# Patient Record
Sex: Female | Born: 1975
Health system: Southern US, Community
[De-identification: ages and names within clinical notes are randomized; demographics above are authoritative.]

## PROBLEM LIST (undated history)

## (undated) DIAGNOSIS — E079 Disorder of thyroid, unspecified: Secondary | ICD-10-CM

## (undated) DIAGNOSIS — E119 Type 2 diabetes mellitus without complications: Secondary | ICD-10-CM

## (undated) DIAGNOSIS — I10 Essential (primary) hypertension: Secondary | ICD-10-CM

## (undated) DIAGNOSIS — E785 Hyperlipidemia, unspecified: Secondary | ICD-10-CM

## (undated) HISTORY — DX: Essential (primary) hypertension: I10

## (undated) HISTORY — DX: Disorder of thyroid, unspecified: E07.9

## (undated) HISTORY — DX: Hyperlipidemia, unspecified: E78.5

## (undated) HISTORY — DX: Type 2 diabetes mellitus without complications: E11.9

---
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
## 1999-06-21 ENCOUNTER — Other Ambulatory Visit: Admission: RE | Admit: 1999-06-21 | Discharge: 1999-06-21 | Payer: Self-pay | Admitting: Obstetrics and Gynecology

## 1999-12-06 ENCOUNTER — Emergency Department (HOSPITAL_COMMUNITY): Admission: EM | Admit: 1999-12-06 | Discharge: 1999-12-06 | Payer: Self-pay | Admitting: Emergency Medicine

## 2000-09-22 ENCOUNTER — Other Ambulatory Visit: Admission: RE | Admit: 2000-09-22 | Discharge: 2000-09-22 | Payer: Self-pay | Admitting: Obstetrics and Gynecology

## 2001-09-29 ENCOUNTER — Other Ambulatory Visit: Admission: RE | Admit: 2001-09-29 | Discharge: 2001-09-29 | Payer: Self-pay | Admitting: Obstetrics and Gynecology

## 2001-12-29 ENCOUNTER — Other Ambulatory Visit: Admission: RE | Admit: 2001-12-29 | Discharge: 2001-12-29 | Payer: Self-pay | Admitting: Obstetrics and Gynecology

## 2002-05-28 ENCOUNTER — Other Ambulatory Visit: Admission: RE | Admit: 2002-05-28 | Discharge: 2002-05-28 | Payer: Self-pay | Admitting: Obstetrics and Gynecology

## 2002-07-28 ENCOUNTER — Ambulatory Visit (HOSPITAL_COMMUNITY): Admission: RE | Admit: 2002-07-28 | Discharge: 2002-07-28 | Payer: Self-pay | Admitting: Obstetrics and Gynecology

## 2002-07-28 ENCOUNTER — Encounter: Payer: Self-pay | Admitting: Obstetrics and Gynecology

## 2002-08-05 ENCOUNTER — Inpatient Hospital Stay (HOSPITAL_COMMUNITY): Admission: AD | Admit: 2002-08-05 | Discharge: 2002-08-05 | Payer: Self-pay | Admitting: Obstetrics and Gynecology

## 2002-08-28 ENCOUNTER — Inpatient Hospital Stay (HOSPITAL_COMMUNITY): Admission: AD | Admit: 2002-08-28 | Discharge: 2002-08-28 | Payer: Self-pay | Admitting: Obstetrics and Gynecology

## 2002-09-06 ENCOUNTER — Encounter: Payer: Self-pay | Admitting: Obstetrics and Gynecology

## 2002-09-06 ENCOUNTER — Ambulatory Visit (HOSPITAL_COMMUNITY): Admission: RE | Admit: 2002-09-06 | Discharge: 2002-09-06 | Payer: Self-pay | Admitting: Obstetrics and Gynecology

## 2002-09-15 ENCOUNTER — Inpatient Hospital Stay (HOSPITAL_COMMUNITY): Admission: AD | Admit: 2002-09-15 | Discharge: 2002-09-15 | Payer: Self-pay | Admitting: Obstetrics and Gynecology

## 2002-10-07 ENCOUNTER — Ambulatory Visit (HOSPITAL_COMMUNITY): Admission: RE | Admit: 2002-10-07 | Discharge: 2002-10-07 | Payer: Self-pay | Admitting: Obstetrics and Gynecology

## 2002-10-07 ENCOUNTER — Encounter: Payer: Self-pay | Admitting: Obstetrics and Gynecology

## 2002-10-16 ENCOUNTER — Inpatient Hospital Stay (HOSPITAL_COMMUNITY): Admission: AD | Admit: 2002-10-16 | Discharge: 2002-10-16 | Payer: Self-pay | Admitting: Obstetrics and Gynecology

## 2002-10-25 ENCOUNTER — Inpatient Hospital Stay (HOSPITAL_COMMUNITY): Admission: AD | Admit: 2002-10-25 | Discharge: 2002-10-30 | Payer: Self-pay | Admitting: Obstetrics and Gynecology

## 2002-10-29 ENCOUNTER — Encounter (INDEPENDENT_AMBULATORY_CARE_PROVIDER_SITE_OTHER): Payer: Self-pay

## 2002-12-10 ENCOUNTER — Other Ambulatory Visit: Admission: RE | Admit: 2002-12-10 | Discharge: 2002-12-10 | Payer: Self-pay | Admitting: Obstetrics and Gynecology

## 2003-08-03 ENCOUNTER — Other Ambulatory Visit: Admission: RE | Admit: 2003-08-03 | Discharge: 2003-08-03 | Payer: Self-pay | Admitting: Obstetrics and Gynecology

## 2003-08-03 ENCOUNTER — Other Ambulatory Visit: Admission: RE | Admit: 2003-08-03 | Discharge: 2003-08-03 | Payer: Self-pay | Admitting: Internal Medicine

## 2004-03-09 ENCOUNTER — Other Ambulatory Visit: Admission: RE | Admit: 2004-03-09 | Discharge: 2004-03-09 | Payer: Self-pay | Admitting: Obstetrics and Gynecology

## 2004-10-04 ENCOUNTER — Other Ambulatory Visit: Admission: RE | Admit: 2004-10-04 | Discharge: 2004-10-04 | Payer: Self-pay | Admitting: Obstetrics and Gynecology

## 2005-05-07 ENCOUNTER — Other Ambulatory Visit: Admission: RE | Admit: 2005-05-07 | Discharge: 2005-05-07 | Payer: Self-pay | Admitting: Obstetrics and Gynecology

## 2006-06-19 ENCOUNTER — Other Ambulatory Visit: Admission: RE | Admit: 2006-06-19 | Discharge: 2006-06-19 | Payer: Self-pay | Admitting: Obstetrics and Gynecology

## 2007-03-12 ENCOUNTER — Encounter (INDEPENDENT_AMBULATORY_CARE_PROVIDER_SITE_OTHER): Payer: Self-pay | Admitting: *Deleted

## 2007-03-12 ENCOUNTER — Ambulatory Visit (HOSPITAL_COMMUNITY): Admission: RE | Admit: 2007-03-12 | Discharge: 2007-03-12 | Payer: Self-pay | Admitting: Family Medicine

## 2007-05-23 ENCOUNTER — Emergency Department (HOSPITAL_COMMUNITY): Admission: EM | Admit: 2007-05-23 | Discharge: 2007-05-23 | Payer: Self-pay | Admitting: Family Medicine

## 2008-01-03 IMAGING — US US ABDOMEN COMPLETE
1 series · 14 of 25 positions shown · non-contrast
Comparison: none

CLINICAL DATA: Elevated LFTs.  
ABDOMEN ULTRASOUND:
TECHNIQUE: Complete abdominal ultrasound examination was performed including evaluation of the liver, gallbladder, bile ducts, pancreas, kidneys, spleen, IVC, and abdominal aorta.

[Series 1: unknown · 0.32mm/px · 14 of 58 slices shown]
[im 1/58]
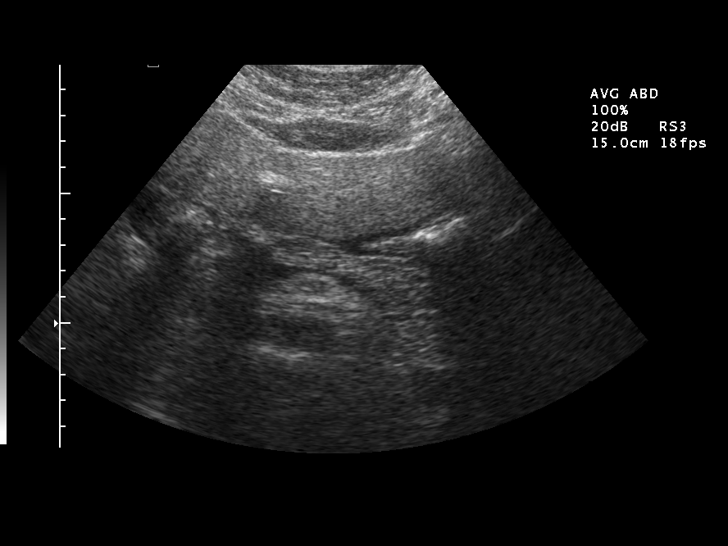
[im 5/58]
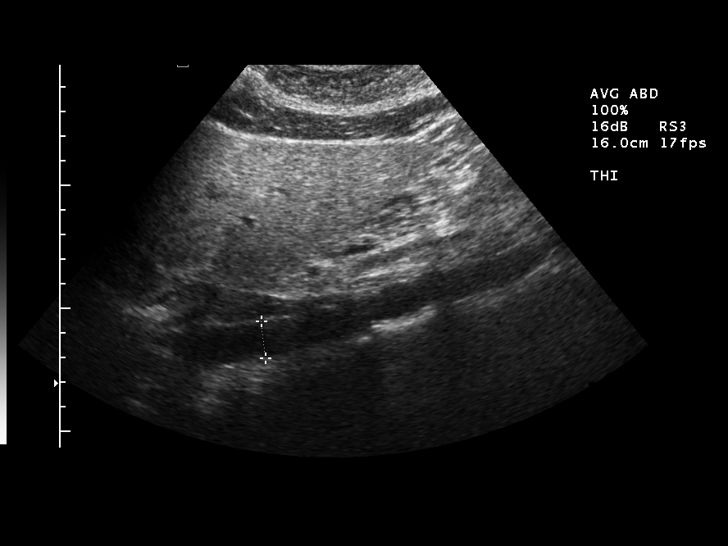
[im 10/58]
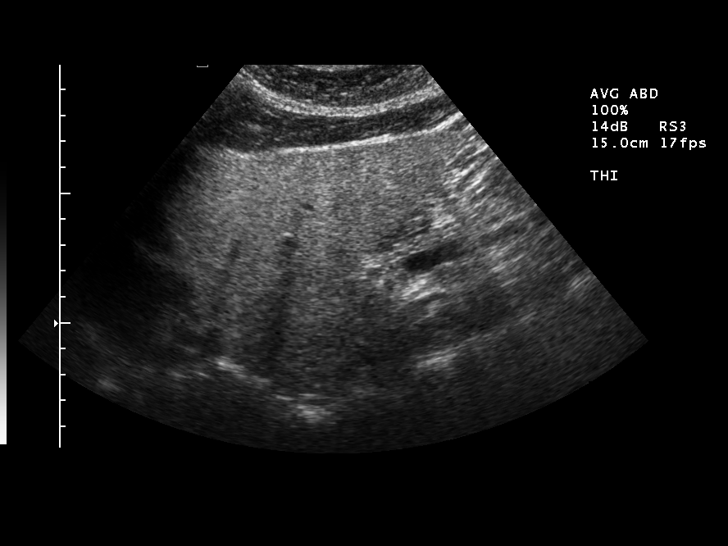
[im 15/58]
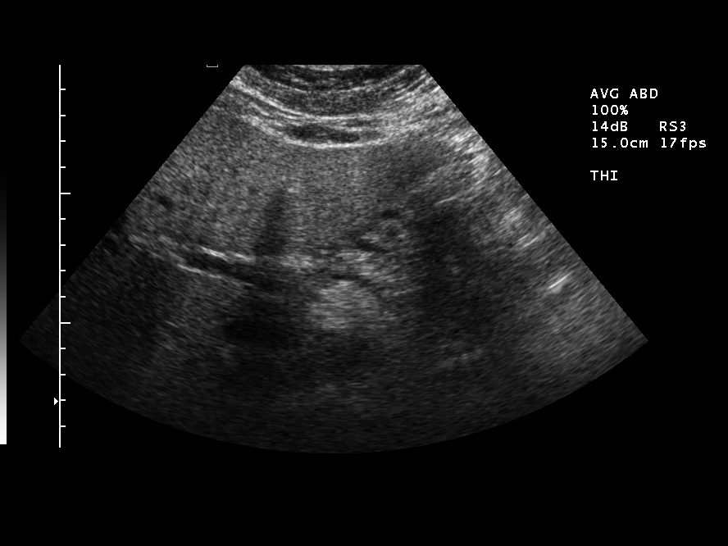
[im 20/58]
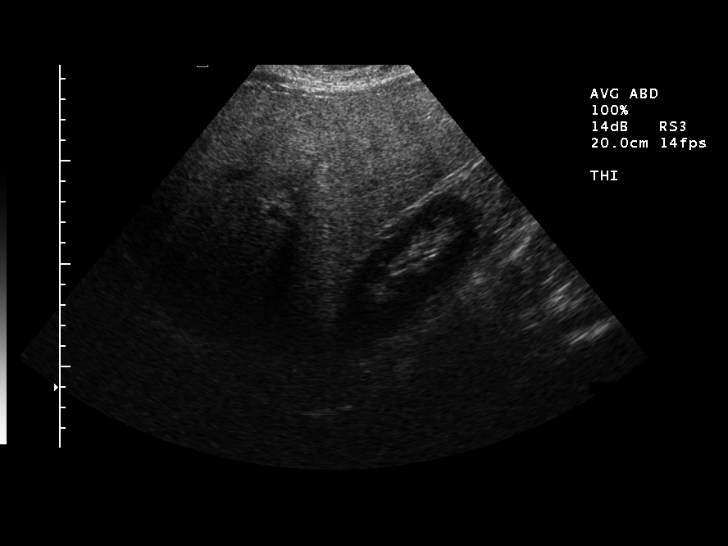
[im 22/58]
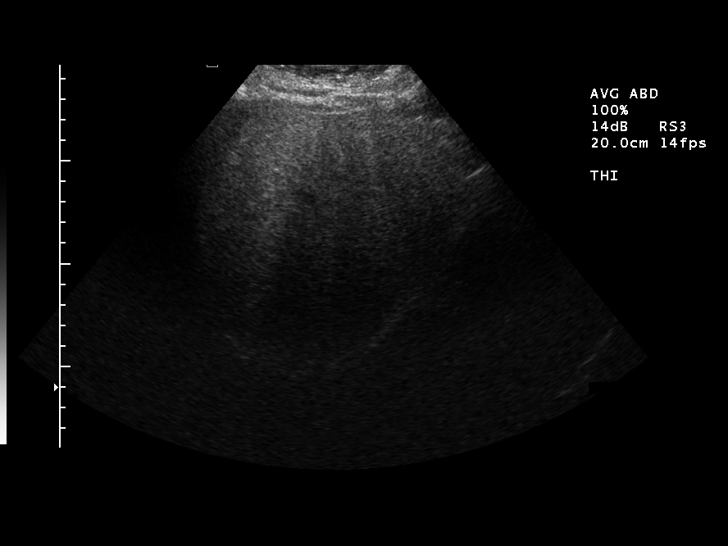
[im 27/58]
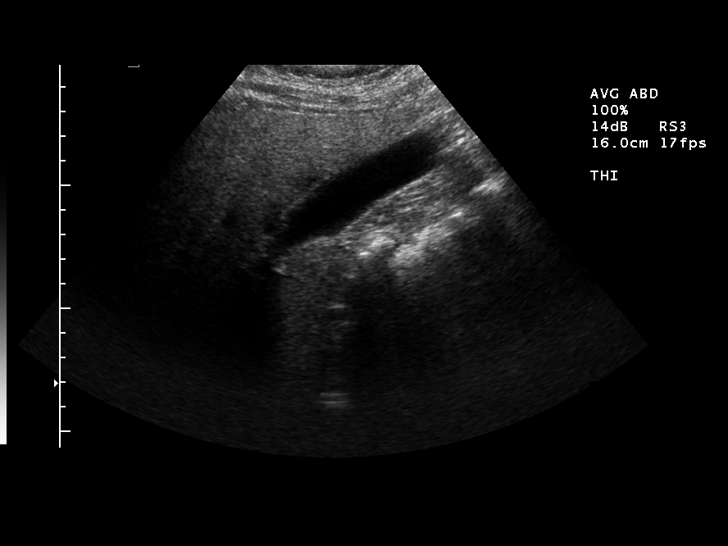
[im 31/58]
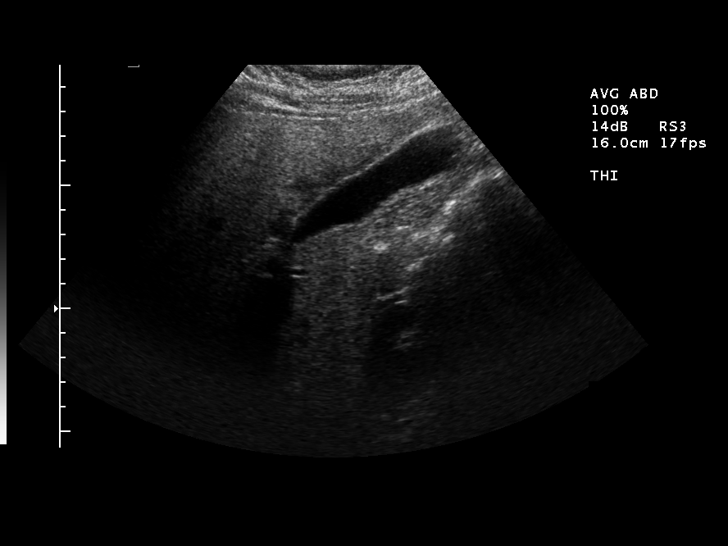
[im 36/58]
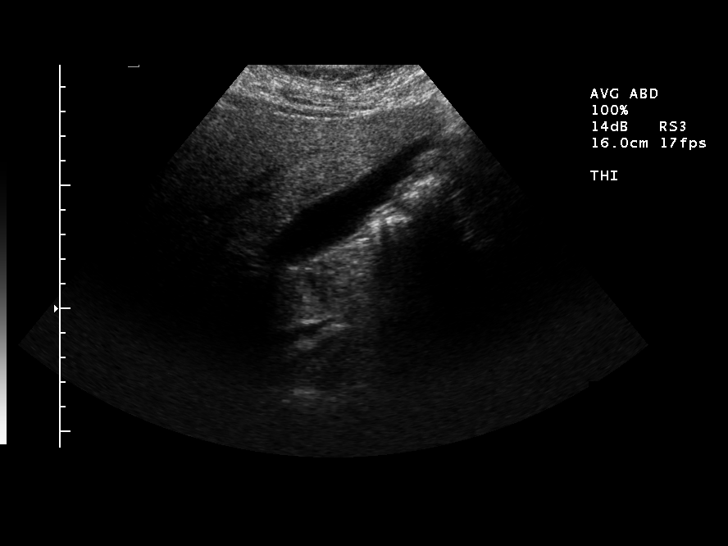
[im 39/58]
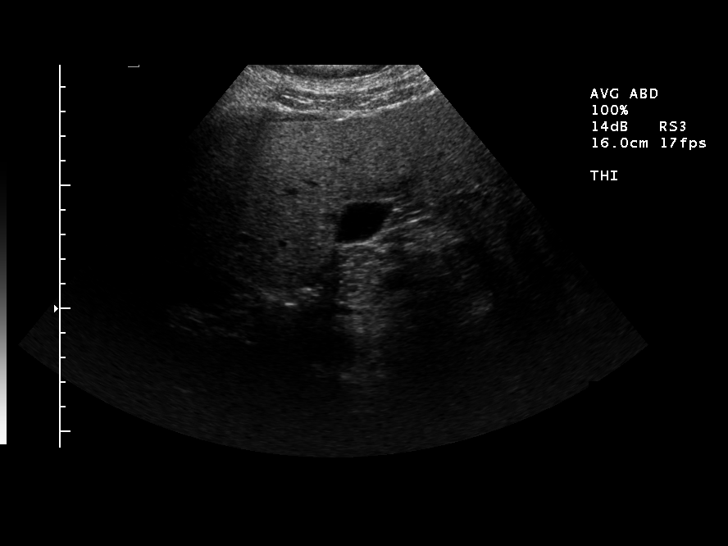
[im 43/58]
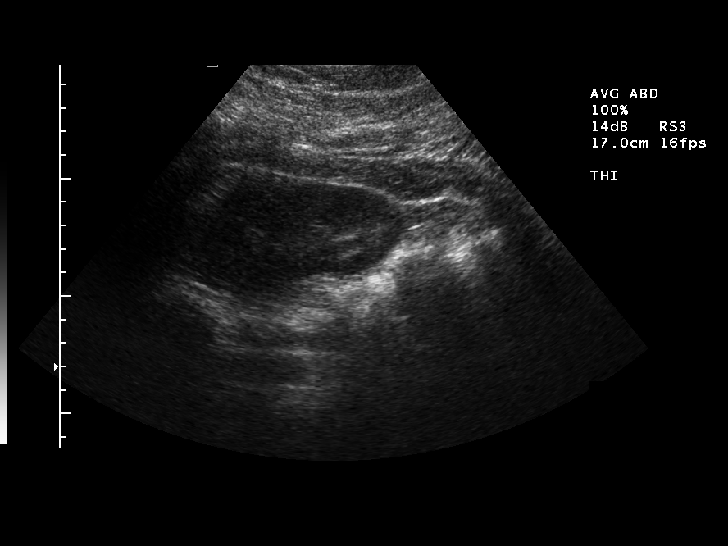
[im 48/58]
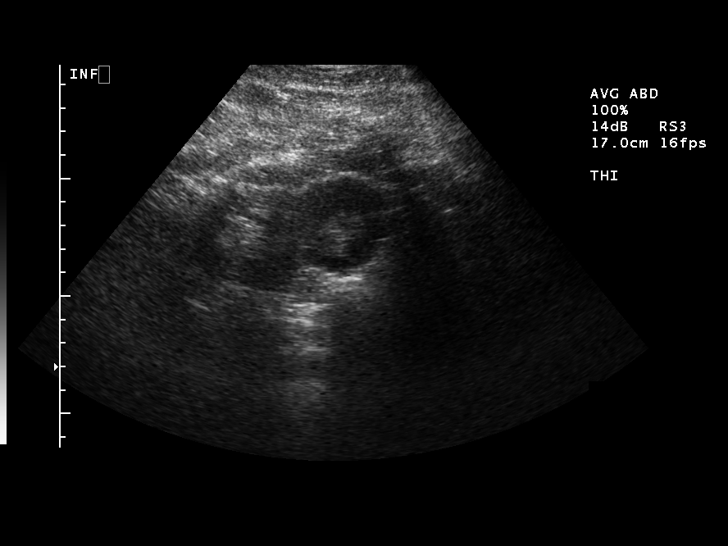
[im 53/58]
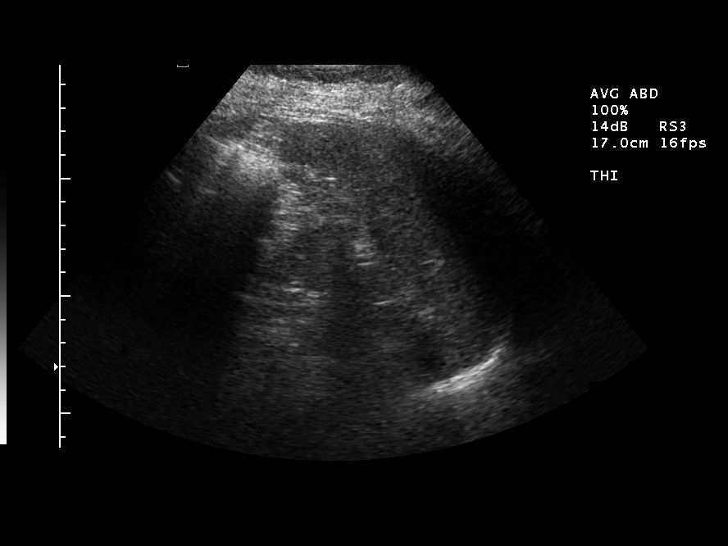
[im 58/58]
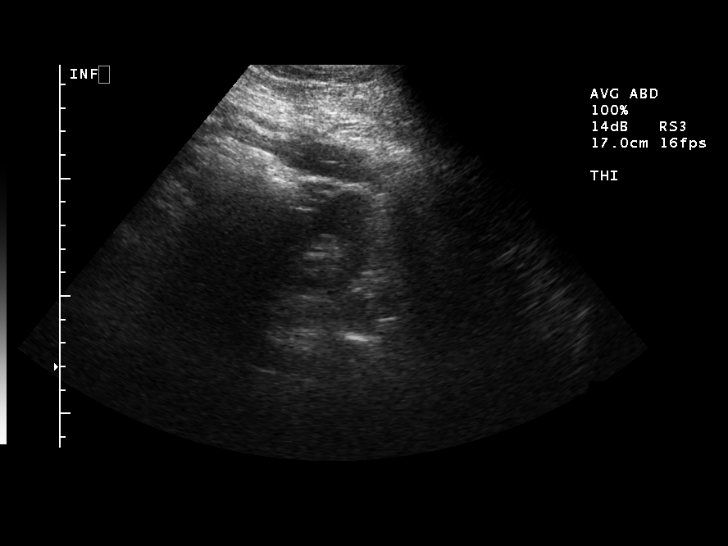

[14 of 25 positions shown; findings below may reference images not displayed]

FINDINGS: Normal gallbladder.  Gallbladder wall thickness is 2.3 mm.  Common duct measures 4.8 mm in diameter which is within normal limits.  Diffuse increase in hepatic echogenicity compatible with diffuse hepatocellular disease, likely fatty infiltration.  Spleen measures 12.0 cm in length.  Normal pancreas and kidneys.  Right and left kidneys measure 11.6 cm and 11.3 cm in length, respectively.  The maximum abdominal aortic diameter is 1.5 cm.  Patent IVC.  No acute abdominal process.
IMPRESSION: Findings are compatible with diffuse fatty infiltration of the liver.  Normal gallbladder.  No biliary ductal dilatation.

## 2008-09-21 DIAGNOSIS — K7689 Other specified diseases of liver: Secondary | ICD-10-CM | POA: Insufficient documentation

## 2008-09-21 DIAGNOSIS — E039 Hypothyroidism, unspecified: Secondary | ICD-10-CM | POA: Insufficient documentation

## 2008-09-21 DIAGNOSIS — K625 Hemorrhage of anus and rectum: Secondary | ICD-10-CM | POA: Insufficient documentation

## 2008-09-27 ENCOUNTER — Ambulatory Visit: Payer: Self-pay | Admitting: Internal Medicine

## 2008-09-27 DIAGNOSIS — R112 Nausea with vomiting, unspecified: Secondary | ICD-10-CM | POA: Insufficient documentation

## 2008-10-11 ENCOUNTER — Ambulatory Visit: Payer: Self-pay | Admitting: Internal Medicine

## 2008-11-07 ENCOUNTER — Telehealth: Payer: Self-pay | Admitting: Internal Medicine

## 2008-11-07 DIAGNOSIS — K649 Unspecified hemorrhoids: Secondary | ICD-10-CM | POA: Insufficient documentation

## 2008-11-08 ENCOUNTER — Ambulatory Visit (HOSPITAL_COMMUNITY): Admission: RE | Admit: 2008-11-08 | Discharge: 2008-11-08 | Payer: Self-pay | Admitting: Internal Medicine

## 2008-11-08 ENCOUNTER — Ambulatory Visit: Payer: Self-pay | Admitting: Internal Medicine

## 2008-12-07 ENCOUNTER — Ambulatory Visit: Payer: Self-pay | Admitting: Internal Medicine

## 2010-04-17 ENCOUNTER — Emergency Department (HOSPITAL_COMMUNITY): Admission: EM | Admit: 2010-04-17 | Discharge: 2010-04-17 | Payer: Self-pay | Admitting: Emergency Medicine

## 2011-03-08 NOTE — Discharge Summary (Signed)
NAME:  Cheryl Hull, Cheryl Hull                           ACCOUNT NO.:  [id number]   MEDICAL RECORD NO.:  [id number]                   PATIENT TYPE:  INP   LOCATION:  9122                                 FACILITY:  [facility]   PHYSICIAN:  Zenaida Niece, M.D.             DATE OF BIRTH:  [birth year]   DATE OF ADMISSION:  10/25/2002  DATE OF DISCHARGE:  10/30/2002                                 DISCHARGE SUMMARY   ADMISSION DIAGNOSIS:  Intrauterine pregnancy at 31 weeks, preterm labor.   DISCHARGE DIAGNOSIS:  Intrauterine pregnancy at 31 weeks, preterm labor.   PROCEDURE:  On January 9th, she had a vaginal birth after cesarean section.   HISTORY AND PHYSICAL:  This is a 35 year old white female, gravida 3, para 0-  2-0-02, with an EGA of [redacted] weeks by an 8-week ultrasound with a due date of  March 8, who presents with a complaint of increasing contractions and with  cervical change by an exam in the office.  The pregnancy has been  complicated by preterm contractions and a history of preterm delivery x2.  She had been getting weekly progesterone injections since 24 weeks and has  been on terbutaline p.r.n.  She had a negative fetal fibronectin x2.  On the  day of admission, the patient complained of increased contractions and  nausea, and exam in the office revealed her to be 1, 50, minus 2, which is a  change from her previous exams.  She was sent to maternity admissions where  her contractions persisted despite subcu terbutaline x2.  Prenatal care also  complicated by diarrhea at 10 weeks with a negative stool culture which  improved with increased fiber, mood swings treated with Prozac, viral URI at  26 weeks, and the above-mentioned preterm contractions.  She also had a  recent ultrasound which revealed no cervical shortening or dilation.   PRENATAL LABORATORY DATA:  Blood type is O positive with a negative antibody  screen, RPR nonreactive, rubella immune, hepatitis B surface antigen  negative, HIV negative, gonorrhea and Chlamydia negative, triple screen  normal.  One-hour Glucola 145; 3-hour GTT 75, 124, 84, and 96.   PAST OB HISTORY:  1993 vaginal delivery at 35 weeks following  __________  complicated by preterm labor.  1998, low transverse cesarean section at 34  weeks, 6 pounds 1 ounce preterm labor with nonreassuring fetal heart tracing  and probable abruption from which she is cleared for trial labor.   GYNECOLOGIC HISTORY:  CIN 1 in April 2003.   PAST MEDICAL HISTORY:  Hypothyroidism.   PAST SURGICAL HISTORY:  Low transverse cesarean section.   MEDICATIONS:  Weekly progesterone injections, Prozac 20 mg p.o. daily,  Synthroid 75 mcg daily.   PHYSICAL EXAMINATION:  On admission, she was afebrile with stable vital  signs.  Fetal heart tracing reactive with contractions every 3-6 minutes.  Abdomen gravid, nontender, with an appropriate fundal height.  Vaginal exam  was 1, 50, minus 2, vertex presentation, intact bag of water.   HOSPITAL COURSE:  The patient was admitted due to the failure of her  contractions to quiesce with terbutaline.  She was placed on magnesium  sulfate for tocolysis and given betamethasone for pulmonary maturation.  Urinalysis was normal, and a group B strep was performed.  Contractions  slowed down but did not stop.  The magnesium was increased.  On the morning  of January 7, she was on magnesium 2 g/hour.  Contractions were less  frequent but as strong as without her magnesium.  The patient requested  discontinuation of the magnesium.  Risks and options were discussed with the  patient, and magnesium was discontinued.  Contractions initially did not  change.  She was tried on Procardia 10 mg q.6h. and observed.  Even though  contractions increased, she continued to decline magnesium.  On the morning  of January 8, Dr. Ambrose Mantle checked the patient.  Her cervix was 2+, 80, and  minus 1 with a vertex presentation.  She was then  transferred to labor and  delivery and continued to contract.  Group B strep performed on admission  came back negative.  Early on the morning of January 9, she progressed to 4,  90, minus 1 to zero station, and an amniotomy was performed by Dr. Ambrose Mantle  which revealed clear fluid.  She received an epidural.  She then progressed  fairly rapidly to complete dilation, had a vertex of +3 station, and  developed fetal bradycardia.  She had a spontaneous VBAC with two pushes  with a viable female infant with Apgars of 7 and 8 that weighed 3 pounds, 15  ounces.  Placenta was intact, and uterus was normal.  She had a first-degree  laceration repaired with 2-0 Vicryl, and estimated blood loss was 500 cc.  Postpartum, she did very well, was able to ambulate, tolerate her diet, and  remained afebrile.  Predelivery hemoglobin 10.6, post delivery 11.3.  On the  morning of postpartum day #1, the patient requested discharge home.  The  baby was stable in the NICU without significant problems.   DISCHARGE INSTRUCTIONS:  Regular diet, pelvic rest.  Follow up in six weeks.   MEDICATIONS:  Over-the-counter Motrin p.r.n., and she was given our  discharge pamphlet.                                               Zenaida Niece, M.D.    TDM/MEDQ  D:  10/30/2002  T:  10/31/2002  Job:  [id number]

## 2015-09-21 ENCOUNTER — Encounter: Payer: Self-pay | Admitting: Internal Medicine

## 2015-10-22 ENCOUNTER — Emergency Department (HOSPITAL_COMMUNITY)
Admission: EM | Admit: 2015-10-22 | Discharge: 2015-10-23 | Disposition: A | Discharge status: Home / Self Care | Payer: 59 | Attending: Emergency Medicine | Admitting: Emergency Medicine

## 2015-10-22 ENCOUNTER — Encounter (HOSPITAL_COMMUNITY): Payer: Self-pay | Admitting: Family Medicine

## 2015-10-22 DIAGNOSIS — Z79899 Other long term (current) drug therapy: Secondary | ICD-10-CM | POA: Insufficient documentation

## 2015-10-22 DIAGNOSIS — T783XXA Angioneurotic edema, initial encounter: Secondary | ICD-10-CM | POA: Diagnosis not present

## 2015-10-22 DIAGNOSIS — T464X5A Adverse effect of angiotensin-converting-enzyme inhibitors, initial encounter: Secondary | ICD-10-CM | POA: Insufficient documentation

## 2015-10-22 DIAGNOSIS — R220 Localized swelling, mass and lump, head: Secondary | ICD-10-CM | POA: Diagnosis present

## 2015-10-22 MED ORDER — SODIUM CHLORIDE 0.9 % IV BOLUS (SEPSIS)
1000.0000 mL | Freq: Once | INTRAVENOUS | Status: AC
  Administered 2015-10-23: 1000 mL via INTRAVENOUS

## 2015-10-22 MED ORDER — METHYLPREDNISOLONE SODIUM SUCC 125 MG IJ SOLR
125.0000 mg | Freq: Once | INTRAMUSCULAR | Status: AC
  Administered 2015-10-22: 125 mg via INTRAVENOUS
  Filled 2015-10-22: qty 2

## 2015-10-22 MED ORDER — FAMOTIDINE IN NACL 20-0.9 MG/50ML-% IV SOLN
20.0000 mg | Freq: Once | INTRAVENOUS | Status: AC
  Administered 2015-10-22: 20 mg via INTRAVENOUS
  Filled 2015-10-22: qty 50

## 2015-10-22 MED ORDER — EPINEPHRINE 0.3 MG/0.3ML IJ SOAJ
0.3000 mg | Freq: Once | INTRAMUSCULAR | Status: AC | PRN
  Administered 2015-10-23: 0.3 mg via INTRAMUSCULAR
  Filled 2015-10-22: qty 0.3

## 2015-10-22 MED ORDER — DIPHENHYDRAMINE HCL 50 MG/ML IJ SOLN
25.0000 mg | Freq: Once | INTRAMUSCULAR | Status: AC
Start: 2015-10-23 — End: 2015-10-22
  Administered 2015-10-22: 25 mg via INTRAVENOUS
  Filled 2015-10-22: qty 1

## 2015-10-22 NOTE — ED Provider Notes (Signed)
CSN: [id number]     Arrival date & time 10/22/15  2331 History  .By signing my name below, I, Cheryl Hull, attest that this documentation has been prepared under the direction and in the presence of TRW AutomotiveKelly Jenson Beedle, PA-C. Electronically Signed: Phillis HaggisGabriella Hull, ED Scribe. 10/22/2015. 11:51 PM.  Chief Complaint  Patient presents with  . Allergic Reaction   The history is provided by the patient. No language interpreter was used.   HPI Comments: Cheryl Hull is a 40 y.o. female who presents to the Emergency Department complaining of facial swelling onset 10 hours ago. Pt states that the swelling first began with her upper lip and progressed to her bilateral cheeks and eyes. Pt is on Lisinopril and has been for years. She states that she has taken two benadryl to no relief. She denies throat swelling, tongue swelling, trouble swallowing, chest pain, SOB, or wheezing.   History reviewed. No pertinent past medical history. History reviewed. No pertinent past surgical history. History reviewed. No pertinent family history. Social History  Substance Use Topics  . Smoking status: Never Smoker   . Smokeless tobacco: None  . Alcohol Use: No   OB History    No data available      Review of Systems  HENT: Positive for facial swelling. Negative for trouble swallowing.   Respiratory: Negative for shortness of breath.   All other systems reviewed and are negative.   Allergies  Review of patient's allergies indicates no known allergies.  Home Medications   Prior to Admission medications   Medication Sig Start Date End Date Taking? Authorizing Provider  Cholecalciferol (VITAMIN D3) 5000 units TABS Take 5,000 Units by mouth daily.   Yes Historical Provider, MD  LARIN FE 1.5/30 1.5-30 MG-MCG tablet Take 1 tablet by mouth daily. 08/28/15  Yes Historical Provider, MD  levothyroxine (SYNTHROID, LEVOTHROID) 88 MCG tablet Take 88 mcg by mouth daily. 07/19/15  Yes Historical Provider, MD   lisdexamfetamine (VYVANSE) 20 MG capsule Take 20 mg by mouth daily.   Yes Historical Provider, MD  lisinopril (PRINIVIL,ZESTRIL) 5 MG tablet Take 5 mg by mouth daily. 07/26/15  Yes Historical Provider, MD  LORazepam (ATIVAN) 0.5 MG tablet Take 0.5 mg by mouth 2 (two) times daily as needed. 09/01/15  Yes Historical Provider, MD  sertraline (ZOLOFT) 100 MG tablet Take 100 mg by mouth every evening. 09/01/15  Yes Historical Provider, MD  simvastatin (ZOCOR) 20 MG tablet Take 20 mg by mouth every evening. 07/19/15  Yes Historical Provider, MD  VASCEPA 1 g CAPS Take 1 g by mouth 2 (two) times daily.  09/08/15  Yes Historical Provider, MD   BP 115/69 mmHg  Pulse 81  Temp(Src) 98.2 F (36.8 C) (Oral)  Resp 16  Ht 5\' 4"  (1.626 m)  Wt 72.576 kg  BMI 27.45 kg/m2  SpO2 99%  LMP 09/21/2015   Physical Exam  Constitutional: She is oriented to person, place, and time. She appears well-developed and well-nourished. No distress.  Nontoxic/nonseptic appearing  HENT:  Head: Normocephalic and atraumatic.  Right Ear: External ear normal.  Left Ear: External ear normal.  Nose: Nose normal.  Mouth/Throat: Uvula is midline, oropharynx is clear and moist and mucous membranes are normal. No posterior oropharyngeal edema.  Angioedema noted inferior to eyes and to bilateral cheeks and upper lip. No posterior oropharyngeal edema. Uvula midline. Patient tolerating secretions without difficulty. No tongue swelling.  Eyes: Conjunctivae and EOM are normal. No scleral icterus.  Neck: Normal range of motion.  No  stridor  Cardiovascular: Normal rate, regular rhythm and intact distal pulses.   Pulmonary/Chest: Effort normal and breath sounds normal. No respiratory distress. She has no wheezes. She has no rales.  Respirations even and unlabored. Lungs clear.  Musculoskeletal: Normal range of motion.  Neurological: She is alert and oriented to person, place, and time. She exhibits normal muscle tone. Coordination  normal.  Skin: Skin is warm and dry. No rash noted. She is not diaphoretic. No erythema. No pallor.  Psychiatric: She has a normal mood and affect. Her behavior is normal.  Nursing note and vitals reviewed.   ED Course  Procedures (including critical care time) DIAGNOSTIC STUDIES: Oxygen Saturation is 98% on RA, normal by my interpretation.    COORDINATION OF CARE: 11:49 PM-Discussed treatment plan which includes Solumedrol and IV medications with pt at bedside and pt agreed to plan.    Labs Review Labs Reviewed - No data to display  Imaging Review No results found.   I have personally reviewed and evaluated these images and lab results as part of my medical decision-making.   EKG Interpretation None      MDM   Final diagnoses:  ACE inhibitor-aggravated angioedema, initial encounter    40 year old female presents to the emergency department for evaluation of angioedema secondary to lisinopril use. She is noted to have bilateral facial swelling as well as upper lip swelling. No tongue swelling or swelling of the posterior oropharynx. Patient denies any difficulty swallowing or drooling. She has no stridor. Lungs are clear bilaterally and there is no hypoxia. Patient has had worsening of symptoms since 1400 yesterday. She was adamant on not being admitted as she wanted to go to work this morning. 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  Patient has been monitored in the emergency department for almost 7 hours. She has been given Benadryl, Pepcid, Solu-Medrol as well as IV fluids and epinephrine. Patient has had slight improvement in her angioedema since arrival. She states that she "feels fine" and wants to be discharged to go to work. Given improvement in her symptoms with prolonged monitoring, I do not see any indication for further emergent workup or admission currently. Patient has been advised to promptly return to the emergency department should her symptoms worsen.  She has been instructed to discontinue Lisinopril and to follow-up with her primary care doctor. Return precautions given at discharge. Patient discharged in good condition with no unaddressed concerns.  I personally performed the services described in this documentation, which was scribed in my presence. The recorded information has been reviewed and is accurate.    Filed Vitals:   10/23/15 0357 10/23/15 0447 10/23/15 0523 10/23/15 0608  BP: 93/70 102/72 106/70 115/69  Pulse: 82 90 82 81  Temp:      TempSrc:      Resp: 16 14 15 16   Height:      Weight:      SpO2: 98% 98% 98% 99%      Antony Madura, PA-C 10/23/15 8119  April Palumbo, MD 10/23/15 [phone]

## 2015-10-22 NOTE — ED Notes (Signed)
Patient reports yesterday around 14:00, she noticed upper lip swelling. Tonight, patient has upper lip swelling, facial swelling, and eye swelling. Pt denies feeling throat swelling. Pt talking in clear sentences. Appears in no acute distress. Pt takes Lisinopril.

## 2015-10-23 DIAGNOSIS — T464X5A Adverse effect of angiotensin-converting-enzyme inhibitors, initial encounter: Secondary | ICD-10-CM | POA: Diagnosis not present

## 2015-10-23 DIAGNOSIS — Z79899 Other long term (current) drug therapy: Secondary | ICD-10-CM | POA: Diagnosis not present

## 2015-10-23 DIAGNOSIS — T783XXA Angioneurotic edema, initial encounter: Secondary | ICD-10-CM | POA: Diagnosis not present

## 2015-10-23 NOTE — Discharge Instructions (Signed)
Stop taking lisinopril and follow-up with your primary care doctor to discuss changes to your blood pressure medications. Take Benadryl and Pepcid as needed for persistent swelling. Return to the emergency department if symptoms worsen.  Angioedema Angioedema is a sudden swelling of tissues, often of the skin. It can occur on the face or genitals or in the abdomen or other body parts. The swelling usually develops over a short period and gets better in 24 to 48 hours. It often begins during the night and is found when the person wakes up. The person may also get red, itchy patches of skin (hives). Angioedema can be dangerous if it involves swelling of the air passages.  Depending on the cause, episodes of angioedema may only happen once, come back in unpredictable patterns, or repeat for several years and then gradually fade away.  CAUSES  Angioedema can be caused by an allergic reaction to various triggers. It can also result from nonallergic causes, including reactions to drugs, immune system disorders, viral infections, or an abnormal gene that is passed to you from your parents (hereditary). For some people with angioedema, the cause is unknown.  Some things that can trigger angioedema include:   Foods.   Medicines, such as ACE inhibitors, ARBs, nonsteroidal anti-inflammatory agents, or estrogen.   Latex.   Animal saliva.   Insect stings.   Dyes used in X-rays.   Mild injury.   Dental work.  Surgery.  Stress.   Sudden changes in temperature.   Exercise. SIGNS AND SYMPTOMS   Swelling of the skin.  Hives. If these are present, there is also intense itching.  Redness in the affected area.   Pain in the affected area.  Swollen lips or tongue.  Breathing problems. This may happen if the air passages swell.  Wheezing. If internal organs are involved, there may be:   Nausea.   Abdominal pain.   Vomiting.   Difficulty swallowing.   Difficulty passing  urine. DIAGNOSIS   Your health care provider will examine the affected area and take a medical and family history.  Various tests may be done to help determine the cause. Tests may include:  Allergy skin tests to see if the problem is an allergic reaction.   Blood tests to check for hereditary angioedema.   Tests to check for underlying diseases that could cause the condition.   A review of your medicines, including over-the-counter medicines, may be done. TREATMENT  Treatment will depend on the cause of the angioedema. Possible treatments include:   Removal of anything that triggered the condition (such as stopping certain medicines).   Medicines to treat symptoms or prevent attacks. Medicines given may include:   Antihistamines.   Epinephrine injection.   Steroids.   Hospitalization may be required for severe attacks. If the air passages are affected, it can be an emergency. Tubes may need to be placed to keep the airway open. HOME CARE INSTRUCTIONS   Take all medicines as directed by your health care provider.  If you were given medicines for emergency allergy treatment, always carry them with you.  Wear a medical bracelet as directed by your health care provider.   Avoid known triggers. SEEK MEDICAL CARE IF:   You have repeat attacks of angioedema.   Your attacks are more frequent or more severe despite preventive measures.   You have hereditary angioedema and are considering having children. It is important to discuss with your health care provider the risks of passing the condition on  to your children. SEEK IMMEDIATE MEDICAL CARE IF:   You have severe swelling of the mouth, tongue, or lips.  You have difficulty breathing.   You have difficulty swallowing.   You faint. MAKE SURE YOU:  Understand these instructions.  Will watch your condition.  Will get help right away if you are not doing well or get worse.   This information is not  intended to replace advice given to you by your health care provider. Make sure you discuss any questions you have with your health care provider.   Document Released: 12/16/2001 Document Revised: 10/28/2014 Document Reviewed: 05/31/2013 Elsevier Interactive Patient Education [employer]

## 2015-10-24 MED FILL — METHYLPREDNISOLONE 4 MG TAB: 4 | 6 days supply | Qty: 21 | Fill #0

## 2015-10-24 MED FILL — AMLODIPINE BESYLATE 2.5 MG: 2.5 | 30 days supply | Qty: 30 | Fill #0

## 2015-10-26 DIAGNOSIS — T783XXA Angioneurotic edema, initial encounter: Secondary | ICD-10-CM | POA: Diagnosis not present

## 2015-10-26 DIAGNOSIS — F909 Attention-deficit hyperactivity disorder, unspecified type: Secondary | ICD-10-CM | POA: Diagnosis not present

## 2015-10-26 DIAGNOSIS — I10 Essential (primary) hypertension: Secondary | ICD-10-CM | POA: Diagnosis not present

## 2015-10-26 DIAGNOSIS — E782 Mixed hyperlipidemia: Secondary | ICD-10-CM | POA: Diagnosis not present

## 2015-11-08 MED FILL — ATORVASTATIN 10 MG TABLET: 10 | 30 days supply | Qty: 30 | Fill #0

## 2015-11-16 MED FILL — AMLODIPINE BESYLATE 2.5 MG: 2.5 | 90 days supply | Qty: 90 | Fill #1

## 2015-11-16 MED FILL — LARIN FE 1.5-30 TABLET: 1.5-30 | 84 days supply | Qty: 84 | Fill #4

## 2015-11-20 MED FILL — D-AMPHETAMINE ER 10 MG CAP: 10 | 30 days supply | Qty: 60 | Fill #0

## 2015-11-28 DIAGNOSIS — E782 Mixed hyperlipidemia: Secondary | ICD-10-CM | POA: Diagnosis not present

## 2015-11-28 DIAGNOSIS — E039 Hypothyroidism, unspecified: Secondary | ICD-10-CM | POA: Diagnosis not present

## 2015-11-28 DIAGNOSIS — Z79899 Other long term (current) drug therapy: Secondary | ICD-10-CM | POA: Diagnosis not present

## 2015-11-28 DIAGNOSIS — I10 Essential (primary) hypertension: Secondary | ICD-10-CM | POA: Diagnosis not present

## 2015-11-28 DIAGNOSIS — F1920 Other psychoactive substance dependence, uncomplicated: Secondary | ICD-10-CM | POA: Diagnosis not present

## 2015-11-28 DIAGNOSIS — F419 Anxiety disorder, unspecified: Secondary | ICD-10-CM | POA: Diagnosis not present

## 2015-11-28 DIAGNOSIS — F902 Attention-deficit hyperactivity disorder, combined type: Secondary | ICD-10-CM | POA: Diagnosis not present

## 2015-11-30 MED FILL — ATORVASTATIN 10 MG TABLET: 10 | 90 days supply | Qty: 90 | Fill #0

## 2015-12-12 DIAGNOSIS — Z1231 Encounter for screening mammogram for malignant neoplasm of breast: Secondary | ICD-10-CM | POA: Diagnosis not present

## 2015-12-12 DIAGNOSIS — Z01419 Encounter for gynecological examination (general) (routine) without abnormal findings: Secondary | ICD-10-CM | POA: Diagnosis not present

## 2015-12-12 DIAGNOSIS — Z6828 Body mass index (BMI) 28.0-28.9, adult: Secondary | ICD-10-CM | POA: Diagnosis not present

## 2015-12-12 DIAGNOSIS — I10 Essential (primary) hypertension: Secondary | ICD-10-CM | POA: Diagnosis not present

## 2015-12-12 DIAGNOSIS — R801 Persistent proteinuria, unspecified: Secondary | ICD-10-CM | POA: Diagnosis not present

## 2015-12-12 DIAGNOSIS — Z1389 Encounter for screening for other disorder: Secondary | ICD-10-CM | POA: Diagnosis not present

## 2015-12-12 DIAGNOSIS — Z130 Encounter for screening for diseases of the blood and blood-forming organs and certain disorders involving the immune mechanism: Secondary | ICD-10-CM | POA: Diagnosis not present

## 2015-12-15 MED FILL — D-AMPHETAMINE ER 10 MG CAP: 10 | 30 days supply | Qty: 90 | Fill #0

## 2015-12-20 MED FILL — AMLODIPINE BESYLATE 10 MG T: 10 | 90 days supply | Qty: 90 | Fill #0

## 2016-01-02 MED FILL — METOPROLOL SUCC ER 25 MG TA: 25 | 90 days supply | Qty: 90 | Fill #0

## 2016-01-03 MED FILL — AMLODIPINE BESYLATE 5 MG TA: 5 | 90 days supply | Qty: 90 | Fill #0

## 2016-01-23 MED FILL — D-AMPHETAMINE ER 10 MG CAP: 10 | 30 days supply | Qty: 90 | Fill #0

## 2016-01-26 MED FILL — LEVOTHYROXINE 88 MCG TABLET: 88 | 90 days supply | Qty: 90 | Fill #1

## 2016-02-07 MED FILL — LARIN FE 1.5-30 TABLET: 1.5-30 | 84 days supply | Qty: 84 | Fill #0

## 2016-03-04 DIAGNOSIS — E039 Hypothyroidism, unspecified: Secondary | ICD-10-CM | POA: Diagnosis not present

## 2016-03-04 DIAGNOSIS — F419 Anxiety disorder, unspecified: Secondary | ICD-10-CM | POA: Diagnosis not present

## 2016-03-04 DIAGNOSIS — E782 Mixed hyperlipidemia: Secondary | ICD-10-CM | POA: Diagnosis not present

## 2016-03-04 DIAGNOSIS — I10 Essential (primary) hypertension: Secondary | ICD-10-CM | POA: Diagnosis not present

## 2016-03-04 MED FILL — ATORVASTATIN 10 MG TABLET: 10 | 90 days supply | Qty: 90 | Fill #0

## 2016-03-04 MED FILL — SERTRALINE HCL 100 MG TAB: 100 | 90 days supply | Qty: 90 | Fill #0

## 2016-03-07 MED FILL — D-AMPHETAMINE ER 10 MG CAP: 10 | 30 days supply | Qty: 90 | Fill #0

## 2016-03-25 DIAGNOSIS — Z79899 Other long term (current) drug therapy: Secondary | ICD-10-CM | POA: Diagnosis not present

## 2016-03-25 DIAGNOSIS — F902 Attention-deficit hyperactivity disorder, combined type: Secondary | ICD-10-CM | POA: Diagnosis not present

## 2016-03-26 MED FILL — AMLODIPINE BESYLATE 2.5 MG: 2.5 | 90 days supply | Qty: 90 | Fill #0

## 2016-03-26 MED FILL — METOPROLOL SUCC ER 25 MG TA: 25 | 90 days supply | Qty: 90 | Fill #1

## 2016-04-24 MED FILL — LARIN FE 1.5-30 TABLET: 1.5-30 | 84 days supply | Qty: 84 | Fill #1

## 2016-04-24 MED FILL — LEVOTHYROXINE 88 MCG TABLET: 88 | 90 days supply | Qty: 90 | Fill #2

## 2016-04-29 MED FILL — D-AMPHETAMINE ER 10 MG CAP: 10 | 30 days supply | Qty: 90 | Fill #0

## 2016-06-03 MED FILL — D-AMPHETAMINE ER 10 MG CAP: 10 | 30 days supply | Qty: 90 | Fill #0

## 2016-06-03 MED FILL — SERTRALINE HCL 100 MG TAB: 100 | 90 days supply | Qty: 90 | Fill #1

## 2016-06-10 MED FILL — ATORVASTATIN 10 MG TABLET: 10 | 90 days supply | Qty: 90 | Fill #1

## 2016-06-25 MED FILL — METOPROLOL SUCC ER 25 MG TA: 25 | 90 days supply | Qty: 90 | Fill #0

## 2016-07-02 MED FILL — AMLODIPINE BESYLATE 2.5 MG: 2.5 | 90 days supply | Qty: 90 | Fill #1

## 2016-07-23 MED FILL — LARIN FE 1.5-30 TABLET: 1.5-30 | 84 days supply | Qty: 84 | Fill #2

## 2016-07-23 MED FILL — LEVOTHYROXINE 88 MCG TABLET: 88 | 90 days supply | Qty: 90 | Fill #3

## 2016-07-24 MED FILL — D-AMPHETAMINE ER 10 MG CAP: 10 | 30 days supply | Qty: 90 | Fill #0

## 2016-09-04 DIAGNOSIS — E039 Hypothyroidism, unspecified: Secondary | ICD-10-CM | POA: Diagnosis not present

## 2016-09-04 DIAGNOSIS — Z79899 Other long term (current) drug therapy: Secondary | ICD-10-CM | POA: Diagnosis not present

## 2016-09-04 DIAGNOSIS — I10 Essential (primary) hypertension: Secondary | ICD-10-CM | POA: Diagnosis not present

## 2016-09-04 DIAGNOSIS — F902 Attention-deficit hyperactivity disorder, combined type: Secondary | ICD-10-CM | POA: Diagnosis not present

## 2016-09-04 DIAGNOSIS — F419 Anxiety disorder, unspecified: Secondary | ICD-10-CM | POA: Diagnosis not present

## 2016-09-04 MED FILL — D-AMPHETAMINE ER 10 MG CAP: 10 | 30 days supply | Qty: 90 | Fill #0

## 2016-09-05 MED FILL — ATORVASTATIN 10 MG TABLET: 10 | 90 days supply | Qty: 90 | Fill #2

## 2016-09-11 MED FILL — SERTRALINE HCL 100 MG TAB: 100 | 30 days supply | Qty: 30 | Fill #0

## 2016-09-24 MED FILL — METOPROLOL SUCC ER 25 MG TA: 25 | 90 days supply | Qty: 90 | Fill #1

## 2016-09-25 MED FILL — AMLODIPINE BESYLATE 2.5 MG: 2.5 | 90 days supply | Qty: 90 | Fill #0

## 2016-10-10 MED FILL — LARIN FE 1.5-30 TABLET: 1.5-30 | 84 days supply | Qty: 84 | Fill #3

## 2016-10-11 MED FILL — D-AMPHETAMINE ER 10 MG CAP: 10 | 30 days supply | Qty: 90 | Fill #0

## 2016-10-15 MED FILL — SERTRALINE HCL 100 MG TAB: 100 | 30 days supply | Qty: 30 | Fill #0

## 2016-10-18 MED FILL — LEVOTHYROXINE 88 MCG TABLET: 88 | 30 days supply | Qty: 30 | Fill #0

## 2016-10-29 DIAGNOSIS — F419 Anxiety disorder, unspecified: Secondary | ICD-10-CM | POA: Diagnosis not present

## 2016-10-29 DIAGNOSIS — E782 Mixed hyperlipidemia: Secondary | ICD-10-CM | POA: Diagnosis not present

## 2016-10-29 DIAGNOSIS — E039 Hypothyroidism, unspecified: Secondary | ICD-10-CM | POA: Diagnosis not present

## 2016-10-29 DIAGNOSIS — I10 Essential (primary) hypertension: Secondary | ICD-10-CM | POA: Diagnosis not present

## 2016-11-13 MED FILL — SERTRALINE HCL 100 MG TAB: 100 | 90 days supply | Qty: 135 | Fill #0

## 2016-11-27 MED FILL — LEVOTHYROXINE 88 MCG TABLET: 88 | 90 days supply | Qty: 90 | Fill #0

## 2016-12-02 MED FILL — D-AMPHETAMINE ER 10 MG CAP: 10 | 30 days supply | Qty: 90 | Fill #0

## 2016-12-03 MED FILL — ATORVASTATIN 10 MG TABLET: 10 | 90 days supply | Qty: 90 | Fill #3

## 2016-12-23 MED FILL — AMLODIPINE BESYLATE 2.5 MG: 2.5 | 90 days supply | Qty: 90 | Fill #0

## 2016-12-23 MED FILL — METOPROLOL SUCC ER 25 MG TA: 25 | 90 days supply | Qty: 90 | Fill #0

## 2017-01-01 DIAGNOSIS — E039 Hypothyroidism, unspecified: Secondary | ICD-10-CM | POA: Diagnosis not present

## 2017-01-01 DIAGNOSIS — I10 Essential (primary) hypertension: Secondary | ICD-10-CM | POA: Diagnosis not present

## 2017-01-01 DIAGNOSIS — F419 Anxiety disorder, unspecified: Secondary | ICD-10-CM | POA: Diagnosis not present

## 2017-01-01 DIAGNOSIS — Z79899 Other long term (current) drug therapy: Secondary | ICD-10-CM | POA: Diagnosis not present

## 2017-01-01 DIAGNOSIS — F902 Attention-deficit hyperactivity disorder, combined type: Secondary | ICD-10-CM | POA: Diagnosis not present

## 2017-01-14 MED FILL — D-AMPHETAMINE ER 10 MG CAP: 10 | 30 days supply | Qty: 90 | Fill #0

## 2017-01-14 MED FILL — LARIN FE 1.5-30 TABLET: 1.5-30 | 84 days supply | Qty: 84 | Fill #4

## 2017-01-27 DIAGNOSIS — F419 Anxiety disorder, unspecified: Secondary | ICD-10-CM | POA: Diagnosis not present

## 2017-01-27 DIAGNOSIS — I10 Essential (primary) hypertension: Secondary | ICD-10-CM | POA: Diagnosis not present

## 2017-01-27 DIAGNOSIS — E782 Mixed hyperlipidemia: Secondary | ICD-10-CM | POA: Diagnosis not present

## 2017-01-27 DIAGNOSIS — E039 Hypothyroidism, unspecified: Secondary | ICD-10-CM | POA: Diagnosis not present

## 2017-01-30 MED FILL — LEVOTHYROXINE 100 MCG TAB: 100 | 30 days supply | Qty: 30 | Fill #0

## 2017-02-18 DIAGNOSIS — Z01419 Encounter for gynecological examination (general) (routine) without abnormal findings: Secondary | ICD-10-CM | POA: Diagnosis not present

## 2017-02-18 DIAGNOSIS — Z6828 Body mass index (BMI) 28.0-28.9, adult: Secondary | ICD-10-CM | POA: Diagnosis not present

## 2017-02-18 DIAGNOSIS — Z1389 Encounter for screening for other disorder: Secondary | ICD-10-CM | POA: Diagnosis not present

## 2017-02-18 DIAGNOSIS — Z3041 Encounter for surveillance of contraceptive pills: Secondary | ICD-10-CM | POA: Diagnosis not present

## 2017-02-18 DIAGNOSIS — Z130 Encounter for screening for diseases of the blood and blood-forming organs and certain disorders involving the immune mechanism: Secondary | ICD-10-CM | POA: Diagnosis not present

## 2017-02-18 DIAGNOSIS — Z1231 Encounter for screening mammogram for malignant neoplasm of breast: Secondary | ICD-10-CM | POA: Diagnosis not present

## 2017-02-28 MED FILL — LEVOTHYROXINE 100 MCG TAB: 100 | 30 days supply | Qty: 30 | Fill #1

## 2017-02-28 MED FILL — SERTRALINE HCL 100 MG TAB: 100 | 90 days supply | Qty: 135 | Fill #1

## 2017-03-03 MED FILL — D-AMPHETAMINE ER 10 MG CAP: 10 | 30 days supply | Qty: 90 | Fill #0

## 2017-03-19 DIAGNOSIS — E039 Hypothyroidism, unspecified: Secondary | ICD-10-CM | POA: Diagnosis not present

## 2017-03-19 MED FILL — LEVOTHYROXINE 112 MCG TAB: 112 | 30 days supply | Qty: 30 | Fill #0

## 2017-03-20 MED FILL — AMLODIPINE BESYLATE 2.5 MG: 2.5 | 90 days supply | Qty: 90 | Fill #1

## 2017-03-27 MED FILL — METOPROLOL SUCC ER 25 MG TA: 25 | 90 days supply | Qty: 90 | Fill #1

## 2017-04-01 MED FILL — LARIN FE 1.5-30 TABLET: 1.5-30 | 84 days supply | Qty: 84 | Fill #0

## 2017-04-14 MED FILL — D-AMPHETAMINE ER 10 MG CAP: 10 | 30 days supply | Qty: 90 | Fill #0

## 2017-04-14 MED FILL — LEVOTHYROXINE 112 MCG TAB: 112 | 30 days supply | Qty: 30 | Fill #1

## 2017-05-05 DIAGNOSIS — E039 Hypothyroidism, unspecified: Secondary | ICD-10-CM | POA: Diagnosis not present

## 2017-05-08 MED FILL — LEVOTHYROXINE 125 MCG TABLE: 125 | 30 days supply | Qty: 30 | Fill #0

## 2017-05-15 DIAGNOSIS — E039 Hypothyroidism, unspecified: Secondary | ICD-10-CM | POA: Diagnosis not present

## 2017-05-15 DIAGNOSIS — F1920 Other psychoactive substance dependence, uncomplicated: Secondary | ICD-10-CM | POA: Diagnosis not present

## 2017-05-15 DIAGNOSIS — I10 Essential (primary) hypertension: Secondary | ICD-10-CM | POA: Diagnosis not present

## 2017-05-15 DIAGNOSIS — F419 Anxiety disorder, unspecified: Secondary | ICD-10-CM | POA: Diagnosis not present

## 2017-05-15 DIAGNOSIS — Z79899 Other long term (current) drug therapy: Secondary | ICD-10-CM | POA: Diagnosis not present

## 2017-05-15 DIAGNOSIS — F902 Attention-deficit hyperactivity disorder, combined type: Secondary | ICD-10-CM | POA: Diagnosis not present

## 2017-05-15 MED FILL — D-AMPHETAMINE ER 10 MG CAP: 10 | 30 days supply | Qty: 90 | Fill #0

## 2017-05-16 MED FILL — ATORVASTATIN 10 MG TABLET: 10 | 90 days supply | Qty: 90 | Fill #0

## 2017-06-02 MED FILL — LEVOTHYROXINE 125 MCG TABLE: 125 | 30 days supply | Qty: 30 | Fill #1

## 2017-06-16 DIAGNOSIS — E039 Hypothyroidism, unspecified: Secondary | ICD-10-CM | POA: Diagnosis not present

## 2017-06-18 MED FILL — LEVOTHYROXINE 137 MCG TABLE: 137 | 30 days supply | Qty: 30 | Fill #0

## 2017-06-19 MED FILL — AMLODIPINE BESYLATE 2.5 MG: 2.5 | 90 days supply | Qty: 90 | Fill #0

## 2017-06-19 MED FILL — LARIN FE 1.5-30 TABLET: 1.5-30 | 84 days supply | Qty: 84 | Fill #1

## 2017-07-01 MED FILL — SERTRALINE HCL 100 MG TAB: 100 | 90 days supply | Qty: 90 | Fill #0

## 2017-07-02 MED FILL — METOPROLOL SUCC ER 25 MG TA: 25 | 90 days supply | Qty: 90 | Fill #0

## 2017-07-12 MED FILL — LEVOTHYROXINE 137 MCG TABLE: 137 | 30 days supply | Qty: 30 | Fill #1

## 2017-07-14 MED FILL — D-AMPHETAMINE ER 10 MG CAP: 10 | 30 days supply | Qty: 90 | Fill #0

## 2017-08-13 DIAGNOSIS — Z79899 Other long term (current) drug therapy: Secondary | ICD-10-CM | POA: Diagnosis not present

## 2017-08-13 DIAGNOSIS — F902 Attention-deficit hyperactivity disorder, combined type: Secondary | ICD-10-CM | POA: Diagnosis not present

## 2017-08-13 MED FILL — ATORVASTATIN 10 MG TABLET: 10 | 90 days supply | Qty: 90 | Fill #1

## 2017-08-13 MED FILL — LEVOTHYROXINE 137 MCG TABLE: 137 | 30 days supply | Qty: 30 | Fill #0

## 2017-08-15 MED FILL — MYDAYIS ER 12.5 MG CAPSULE: 12.5 | 7 days supply | Qty: 7 | Fill #0

## 2017-08-25 DIAGNOSIS — F419 Anxiety disorder, unspecified: Secondary | ICD-10-CM | POA: Diagnosis not present

## 2017-08-25 DIAGNOSIS — I10 Essential (primary) hypertension: Secondary | ICD-10-CM | POA: Diagnosis not present

## 2017-08-25 DIAGNOSIS — E039 Hypothyroidism, unspecified: Secondary | ICD-10-CM | POA: Diagnosis not present

## 2017-08-25 MED FILL — D-AMPHETAMINE ER 10 MG CAP: 10 | 30 days supply | Qty: 90 | Fill #0

## 2017-08-27 MED FILL — LEVOTHYROXINE 150 MCG TAB: 150 | 30 days supply | Qty: 30 | Fill #0

## 2017-09-17 MED FILL — LARIN FE 1.5-30 TABLET: 1.5-30 | 84 days supply | Qty: 84 | Fill #2

## 2017-09-17 MED FILL — AMLODIPINE 2.5 MG TABLET: 2.5 | 90 days supply | Qty: 90 | Fill #0

## 2017-09-25 MED FILL — LEVOTHYROXINE 150 MCG TAB: 150 | 30 days supply | Qty: 30 | Fill #1

## 2017-09-30 MED FILL — METOPROLOL SUCC ER 25 MG TA: 25 | 90 days supply | Qty: 90 | Fill #0

## 2017-10-15 MED FILL — D-AMPHETAMINE ER 10 MG CAP: 10 | 30 days supply | Qty: 90 | Fill #0

## 2017-10-22 MED FILL — SERTRALINE HCL 100 MG TAB: 100 | 90 days supply | Qty: 90 | Fill #1

## 2017-10-23 MED FILL — LEVOTHYROXINE 175 MCG TAB: 175 | 30 days supply | Qty: 30 | Fill #0

## 2017-11-10 DIAGNOSIS — F902 Attention-deficit hyperactivity disorder, combined type: Secondary | ICD-10-CM | POA: Diagnosis not present

## 2017-11-10 DIAGNOSIS — F419 Anxiety disorder, unspecified: Secondary | ICD-10-CM | POA: Diagnosis not present

## 2017-11-10 DIAGNOSIS — Z79899 Other long term (current) drug therapy: Secondary | ICD-10-CM | POA: Diagnosis not present

## 2017-11-10 DIAGNOSIS — I10 Essential (primary) hypertension: Secondary | ICD-10-CM | POA: Diagnosis not present

## 2017-11-12 MED FILL — ATORVASTATIN 10 MG TABLET: 10 | 90 days supply | Qty: 90 | Fill #0

## 2017-11-12 MED FILL — D-AMPHETAMINE ER 10 MG CAP: 10 | 30 days supply | Qty: 90 | Fill #0

## 2017-11-18 MED FILL — LEVOTHYROXINE 175 MCG TAB: 175 | 30 days supply | Qty: 30 | Fill #1

## 2017-12-15 MED FILL — AMLODIPINE 2.5 MG TABLET: 2.5 | 90 days supply | Qty: 90 | Fill #1

## 2017-12-15 MED FILL — LARIN FE 1.5-30 TABLET: 1.5-30 | 84 days supply | Qty: 84 | Fill #3

## 2017-12-19 MED FILL — LEVOTHYROXINE 175 MCG TAB: 175 | 90 days supply | Qty: 90 | Fill #0

## 2017-12-29 MED FILL — METOPROLOL SUCCINATE ER 25: 25 | 90 days supply | Qty: 90 | Fill #1

## 2018-01-12 MED FILL — SERTRALINE HCL 100 MG TAB: 100 | 90 days supply | Qty: 90 | Fill #0

## 2018-01-12 MED FILL — D-AMPHETAMINE ER 10 MG CAP: 10 | 30 days supply | Qty: 60 | Fill #0

## 2018-02-04 MED FILL — ATORVASTATIN 10 MG TABLET: 10 | 90 days supply | Qty: 90 | Fill #1

## 2018-02-05 MED FILL — D-AMPHETAMINE ER 15 MG CAP: 15 | 30 days supply | Qty: 60 | Fill #0

## 2018-02-24 MED FILL — BLISOVI 24 FE TABLET: 1-20 | 84 days supply | Qty: 84 | Fill #0

## 2018-03-18 MED FILL — AMLODIPINE 2.5 MG TABLET: 2.5 | 90 days supply | Qty: 90 | Fill #0

## 2018-03-18 MED FILL — LEVOTHYROXINE 175 MCG TAB: 175 | 90 days supply | Qty: 90 | Fill #0

## 2018-03-23 MED FILL — D-AMPHETAMINE ER 15 MG CAP: 15 | 30 days supply | Qty: 60 | Fill #0

## 2018-03-24 MED FILL — METOPROLOL SUCCINATE ER 25: 25 | 90 days supply | Qty: 90 | Fill #0

## 2018-04-22 MED FILL — SERTRALINE HCL 100 MG TAB: 100 | 90 days supply | Qty: 90 | Fill #1

## 2018-05-04 MED FILL — D-AMPHETAMINE ER 15 MG CAP: 15 | 30 days supply | Qty: 60 | Fill #0

## 2018-05-11 MED FILL — ATORVASTATIN 10 MG TABLET: 10 | 90 days supply | Qty: 90 | Fill #0

## 2018-06-03 MED FILL — LEVOTHYROXINE 200 MCG TAB: 200 | 30 days supply | Qty: 30 | Fill #0

## 2018-06-08 MED FILL — BLISOVI 24 FE TABLET: 1-20 | 84 days supply | Qty: 84 | Fill #1

## 2018-06-18 MED FILL — METOPROLOL SUCCINATE ER 25: 25 | 90 days supply | Qty: 90 | Fill #0

## 2018-06-18 MED FILL — AMLODIPINE 2.5 MG TABLET: 2.5 | 90 days supply | Qty: 90 | Fill #0

## 2018-06-29 MED FILL — SERTRALINE HCL 100 MG TAB: 100 | 90 days supply | Qty: 90 | Fill #0

## 2018-06-29 MED FILL — LEVOTHYROXINE 200 MCG TAB: 200 | 30 days supply | Qty: 30 | Fill #1

## 2018-07-27 MED FILL — D-AMPHETAMINE ER 15 MG CAP: 15 | 30 days supply | Qty: 60 | Fill #0

## 2018-07-31 MED FILL — LEVOTHYROXINE 200 MCG TAB: 200 | 84 days supply | Qty: 90 | Fill #0

## 2018-08-10 MED FILL — ATORVASTATIN 10 MG TABLET: 10 | 90 days supply | Qty: 90 | Fill #1

## 2018-08-28 MED FILL — SERTRALINE HCL 100 MG TAB: 100 | 90 days supply | Qty: 135 | Fill #0

## 2018-09-15 MED FILL — D-AMPHETAMINE ER 15 MG CAP: 15 | 30 days supply | Qty: 60 | Fill #0

## 2018-09-15 MED FILL — AMLODIPINE 2.5 MG TABLET: 2.5 | 90 days supply | Qty: 90 | Fill #1

## 2018-09-28 MED FILL — METOPROLOL SUCCINATE ER 25: 25 | 90 days supply | Qty: 90 | Fill #1

## 2018-10-05 MED FILL — BLISOVI 24 FE TABLET: 1-20 | 84 days supply | Qty: 84 | Fill #2

## 2018-10-19 MED FILL — LEVOTHYROXINE 200 MCG TAB: 200 | 84 days supply | Qty: 90 | Fill #0

## 2018-11-10 DIAGNOSIS — F419 Anxiety disorder, unspecified: Secondary | ICD-10-CM | POA: Diagnosis not present

## 2018-11-10 DIAGNOSIS — F902 Attention-deficit hyperactivity disorder, combined type: Secondary | ICD-10-CM | POA: Diagnosis not present

## 2018-11-10 DIAGNOSIS — Z79899 Other long term (current) drug therapy: Secondary | ICD-10-CM | POA: Diagnosis not present

## 2018-11-10 DIAGNOSIS — E039 Hypothyroidism, unspecified: Secondary | ICD-10-CM | POA: Diagnosis not present

## 2018-11-10 MED FILL — ATORVASTATIN 10 MG TABLET: 10 | 90 days supply | Qty: 90 | Fill #0

## 2018-11-10 MED FILL — D-AMPHETAMINE ER 15 MG CAP: 15 | 30 days supply | Qty: 60 | Fill #0

## 2018-11-20 MED FILL — SERTRALINE HCL 100 MG TAB: 100 | 90 days supply | Qty: 90 | Fill #1

## 2018-11-24 DIAGNOSIS — E039 Hypothyroidism, unspecified: Secondary | ICD-10-CM | POA: Diagnosis not present

## 2018-12-11 MED FILL — AMLODIPINE 2.5 MG TABLET: 2.5 | 90 days supply | Qty: 90 | Fill #0

## 2018-12-24 MED FILL — BLISOVI 24 FE 1-20 MG-MCG(2: 1-20 | 84 days supply | Qty: 84 | Fill #3

## 2018-12-24 MED FILL — METOPROLOL SUCCINATE ER 25: 25 | 90 days supply | Qty: 90 | Fill #0

## 2019-01-04 MED FILL — LEVOTHYROXINE 200 MCG TAB: 200 | 84 days supply | Qty: 90 | Fill #1

## 2019-01-11 MED FILL — SERTRALINE HCL 100 MG TAB: 100 | 90 days supply | Qty: 135 | Fill #1

## 2019-01-12 MED FILL — D-AMPHETAMINE ER 15 MG CAP: 15 | 30 days supply | Qty: 60 | Fill #0

## 2019-01-19 DIAGNOSIS — E039 Hypothyroidism, unspecified: Secondary | ICD-10-CM | POA: Diagnosis not present

## 2019-02-02 MED FILL — ATORVASTATIN 10 MG TABLET: 10 | 90 days supply | Qty: 90 | Fill #1

## 2019-02-05 DIAGNOSIS — F902 Attention-deficit hyperactivity disorder, combined type: Secondary | ICD-10-CM | POA: Diagnosis not present

## 2019-02-05 DIAGNOSIS — Z79899 Other long term (current) drug therapy: Secondary | ICD-10-CM | POA: Diagnosis not present

## 2019-02-05 DIAGNOSIS — F419 Anxiety disorder, unspecified: Secondary | ICD-10-CM | POA: Diagnosis not present

## 2019-03-09 MED FILL — AMLODIPINE 2.5 MG TABLET: 2.5 | 90 days supply | Qty: 90 | Fill #1

## 2019-03-10 MED FILL — D-AMPHETAMINE ER 15 MG CAP: 15 | 30 days supply | Qty: 60 | Fill #0

## 2019-03-19 MED FILL — LEVOTHYROXINE 200 MCG TAB: 200 | 28 days supply | Qty: 30 | Fill #2

## 2019-03-29 MED FILL — METOPROLOL SUCCINATE ER 25: 25 | 90 days supply | Qty: 90 | Fill #1

## 2019-04-14 MED FILL — LEVOTHYROXINE 200 MCG TAB: 200 | 86 days supply | Qty: 105 | Fill #0

## 2019-04-19 MED FILL — BLISOVI 24 FE 1-20 MG-MCG(2: 1-20 | 84 days supply | Qty: 84 | Fill #0

## 2019-04-21 DIAGNOSIS — E039 Hypothyroidism, unspecified: Secondary | ICD-10-CM | POA: Diagnosis not present

## 2019-04-27 MED FILL — SERTRALINE HCL 100 MG TABS: 100 | 90 days supply | Qty: 135 | Fill #0

## 2019-04-27 MED FILL — ATORVASTATIN 10 MG TABLET: 10 | 90 days supply | Qty: 90 | Fill #0

## 2019-05-06 DIAGNOSIS — Z79899 Other long term (current) drug therapy: Secondary | ICD-10-CM | POA: Diagnosis not present

## 2019-05-06 DIAGNOSIS — F419 Anxiety disorder, unspecified: Secondary | ICD-10-CM | POA: Diagnosis not present

## 2019-05-06 DIAGNOSIS — F902 Attention-deficit hyperactivity disorder, combined type: Secondary | ICD-10-CM | POA: Diagnosis not present

## 2019-05-06 MED FILL — D-AMPHETAMINE ER 15 MG CAP: 15 | 30 days supply | Qty: 60 | Fill #0

## 2019-05-19 DIAGNOSIS — Z3041 Encounter for surveillance of contraceptive pills: Secondary | ICD-10-CM | POA: Diagnosis not present

## 2019-05-19 DIAGNOSIS — Z1389 Encounter for screening for other disorder: Secondary | ICD-10-CM | POA: Diagnosis not present

## 2019-05-19 DIAGNOSIS — Z1231 Encounter for screening mammogram for malignant neoplasm of breast: Secondary | ICD-10-CM | POA: Diagnosis not present

## 2019-05-19 DIAGNOSIS — Z130 Encounter for screening for diseases of the blood and blood-forming organs and certain disorders involving the immune mechanism: Secondary | ICD-10-CM | POA: Diagnosis not present

## 2019-05-19 DIAGNOSIS — Z6828 Body mass index (BMI) 28.0-28.9, adult: Secondary | ICD-10-CM | POA: Diagnosis not present

## 2019-05-19 DIAGNOSIS — Z01419 Encounter for gynecological examination (general) (routine) without abnormal findings: Secondary | ICD-10-CM | POA: Diagnosis not present

## 2019-05-25 MED FILL — LO LOESTRIN FE 1-10 TABLET: 1 MG-10 MCG | 84 days supply | Qty: 84 | Fill #0

## 2019-06-15 MED FILL — AMLODIPINE 2.5 MG TABLET: 2.5 | 90 days supply | Qty: 90 | Fill #0

## 2019-06-30 MED FILL — METOPROLOL SUCCINATE ER 25: 25 | 90 days supply | Qty: 90 | Fill #0

## 2019-07-07 MED FILL — LEVOTHYROXINE 200 MCG TAB: 200 | 86 days supply | Qty: 105 | Fill #0

## 2019-07-12 MED FILL — D-AMPHETAMINE ER 15 MG CAP: 15 | 30 days supply | Qty: 60 | Fill #0

## 2019-07-26 MED FILL — SERTRALINE HCL 100 MG TAB: 100 | 90 days supply | Qty: 135 | Fill #0

## 2019-07-27 DIAGNOSIS — F419 Anxiety disorder, unspecified: Secondary | ICD-10-CM | POA: Diagnosis not present

## 2019-07-27 DIAGNOSIS — F902 Attention-deficit hyperactivity disorder, combined type: Secondary | ICD-10-CM | POA: Diagnosis not present

## 2019-07-27 DIAGNOSIS — Z79899 Other long term (current) drug therapy: Secondary | ICD-10-CM | POA: Diagnosis not present

## 2019-08-06 MED FILL — ATORVASTATIN 10 MG TABLET: 10 | 90 days supply | Qty: 90 | Fill #0

## 2019-08-13 DIAGNOSIS — I10 Essential (primary) hypertension: Secondary | ICD-10-CM | POA: Diagnosis not present

## 2019-08-13 DIAGNOSIS — F419 Anxiety disorder, unspecified: Secondary | ICD-10-CM | POA: Diagnosis not present

## 2019-08-13 DIAGNOSIS — E039 Hypothyroidism, unspecified: Secondary | ICD-10-CM | POA: Diagnosis not present

## 2019-08-13 DIAGNOSIS — E782 Mixed hyperlipidemia: Secondary | ICD-10-CM | POA: Diagnosis not present

## 2019-08-13 DIAGNOSIS — F909 Attention-deficit hyperactivity disorder, unspecified type: Secondary | ICD-10-CM | POA: Diagnosis not present

## 2019-09-09 MED FILL — D-AMPHETAMINE ER 15 MG CAP: 15 | 60 days supply | Qty: 60 | Fill #0

## 2019-09-21 MED FILL — AMLODIPINE 2.5 MG TABLET: 2.5 | 90 days supply | Qty: 90 | Fill #0

## 2019-09-27 MED FILL — METOPROLOL SUCCINATE ER 25: 25 | 90 days supply | Qty: 90 | Fill #0

## 2019-09-27 MED FILL — LEVOTHYROXINE 200 MCG TAB: 200 | 86 days supply | Qty: 105 | Fill #0

## 2019-10-25 MED FILL — SERTRALINE HCL 100 MG TAB: 100 | 90 days supply | Qty: 135 | Fill #0

## 2019-11-01 MED FILL — ATORVASTATIN 10 MG TABLET: 10 | 90 days supply | Qty: 90 | Fill #0

## 2019-11-02 MED FILL — D-AMPHETAMINE ER 15 MG CAP: 15 | 30 days supply | Qty: 60 | Fill #0

## 2019-11-23 DIAGNOSIS — Z79899 Other long term (current) drug therapy: Secondary | ICD-10-CM | POA: Diagnosis not present

## 2019-11-23 DIAGNOSIS — F419 Anxiety disorder, unspecified: Secondary | ICD-10-CM | POA: Diagnosis not present

## 2019-11-23 DIAGNOSIS — F902 Attention-deficit hyperactivity disorder, combined type: Secondary | ICD-10-CM | POA: Diagnosis not present

## 2019-12-14 MED FILL — AMLODIPINE 2.5 MG TABLET: 2.5 | 90 days supply | Qty: 90 | Fill #1

## 2019-12-22 MED FILL — METOPROLOL SUCCINATE ER 25: 25 | 90 days supply | Qty: 90 | Fill #1

## 2019-12-22 MED FILL — LEVOTHYROXINE SODIUM 200 MC: 200 | 86 days supply | Qty: 105 | Fill #1

## 2020-01-06 MED FILL — D-AMPHETAMINE ER 15 MG CAP: 15 | 30 days supply | Qty: 60 | Fill #0

## 2020-01-24 MED FILL — SERTRALINE HCL 100 MG TAB: 100 | 90 days supply | Qty: 135 | Fill #1

## 2020-01-31 MED FILL — ATORVASTATIN 10 MG TABLET: 10 | 90 days supply | Qty: 90 | Fill #1

## 2020-02-12 DIAGNOSIS — Z135 Encounter for screening for eye and ear disorders: Secondary | ICD-10-CM | POA: Diagnosis not present

## 2020-02-12 DIAGNOSIS — I10 Essential (primary) hypertension: Secondary | ICD-10-CM | POA: Diagnosis not present

## 2020-02-12 DIAGNOSIS — H524 Presbyopia: Secondary | ICD-10-CM | POA: Diagnosis not present

## 2020-02-12 DIAGNOSIS — E7800 Pure hypercholesterolemia, unspecified: Secondary | ICD-10-CM | POA: Diagnosis not present

## 2020-02-23 ENCOUNTER — Other Ambulatory Visit (HOSPITAL_COMMUNITY): Payer: Self-pay | Admitting: Family Medicine

## 2020-02-23 DIAGNOSIS — I10 Essential (primary) hypertension: Secondary | ICD-10-CM | POA: Diagnosis not present

## 2020-02-23 DIAGNOSIS — E039 Hypothyroidism, unspecified: Secondary | ICD-10-CM | POA: Diagnosis not present

## 2020-02-23 DIAGNOSIS — F419 Anxiety disorder, unspecified: Secondary | ICD-10-CM | POA: Diagnosis not present

## 2020-02-23 DIAGNOSIS — R5383 Other fatigue: Secondary | ICD-10-CM | POA: Diagnosis not present

## 2020-02-23 DIAGNOSIS — E782 Mixed hyperlipidemia: Secondary | ICD-10-CM | POA: Diagnosis not present

## 2020-02-23 MED FILL — AMLODIPINE BESYLATE 5 MG TA: 5 | 90 days supply | Qty: 90 | Fill #0

## 2020-03-06 DIAGNOSIS — F419 Anxiety disorder, unspecified: Secondary | ICD-10-CM | POA: Diagnosis not present

## 2020-03-06 DIAGNOSIS — I10 Essential (primary) hypertension: Secondary | ICD-10-CM | POA: Diagnosis not present

## 2020-03-06 DIAGNOSIS — Z79899 Other long term (current) drug therapy: Secondary | ICD-10-CM | POA: Diagnosis not present

## 2020-03-06 DIAGNOSIS — F902 Attention-deficit hyperactivity disorder, combined type: Secondary | ICD-10-CM | POA: Diagnosis not present

## 2020-03-08 DIAGNOSIS — R7401 Elevation of levels of liver transaminase levels: Secondary | ICD-10-CM | POA: Diagnosis not present

## 2020-03-22 MED FILL — METOPROLOL SUCCINATE ER 25: 25 | 90 days supply | Qty: 90 | Fill #0

## 2020-03-22 MED FILL — LEVOTHYROXINE SODIUM 200 MC: 200 | 90 days supply | Qty: 105 | Fill #0

## 2020-04-06 DIAGNOSIS — R7401 Elevation of levels of liver transaminase levels: Secondary | ICD-10-CM | POA: Diagnosis not present

## 2020-04-25 MED FILL — SERTRALINE HCL 100 MG TABS: 100 | 90 days supply | Qty: 135 | Fill #0

## 2020-04-25 MED FILL — ATORVASTATIN CALCIUM 10 MG: 10 | 90 days supply | Qty: 90 | Fill #0

## 2020-05-02 MED FILL — D-AMPHETAMINE ER 15 MG CAP: 15 | 30 days supply | Qty: 60 | Fill #0

## 2020-05-24 MED FILL — AMLODIPINE BESYLATE 5 MG TA: 5 | 90 days supply | Qty: 90 | Fill #1

## 2020-06-05 ENCOUNTER — Other Ambulatory Visit (HOSPITAL_COMMUNITY): Payer: Self-pay | Admitting: Internal Medicine

## 2020-06-05 DIAGNOSIS — F902 Attention-deficit hyperactivity disorder, combined type: Secondary | ICD-10-CM | POA: Diagnosis not present

## 2020-06-05 DIAGNOSIS — Z79899 Other long term (current) drug therapy: Secondary | ICD-10-CM | POA: Diagnosis not present

## 2020-06-05 DIAGNOSIS — F419 Anxiety disorder, unspecified: Secondary | ICD-10-CM | POA: Diagnosis not present

## 2020-06-19 MED FILL — D-AMPHETAMINE ER 15 MG CAP: 15 | 30 days supply | Qty: 60 | Fill #0

## 2020-06-19 MED FILL — METOPROLOL SUCCINATE ER 25: 25 | 90 days supply | Qty: 90 | Fill #1

## 2020-06-21 MED FILL — LEVOTHYROXINE SODIUM 200 MC: 200 | 90 days supply | Qty: 105 | Fill #1

## 2020-06-22 DIAGNOSIS — Z01419 Encounter for gynecological examination (general) (routine) without abnormal findings: Secondary | ICD-10-CM | POA: Diagnosis not present

## 2020-06-22 DIAGNOSIS — Z1231 Encounter for screening mammogram for malignant neoplasm of breast: Secondary | ICD-10-CM | POA: Diagnosis not present

## 2020-06-22 DIAGNOSIS — Z1389 Encounter for screening for other disorder: Secondary | ICD-10-CM | POA: Diagnosis not present

## 2020-06-22 DIAGNOSIS — Z130 Encounter for screening for diseases of the blood and blood-forming organs and certain disorders involving the immune mechanism: Secondary | ICD-10-CM | POA: Diagnosis not present

## 2020-06-22 DIAGNOSIS — R3121 Asymptomatic microscopic hematuria: Secondary | ICD-10-CM | POA: Diagnosis not present

## 2020-06-22 DIAGNOSIS — Z6829 Body mass index (BMI) 29.0-29.9, adult: Secondary | ICD-10-CM | POA: Diagnosis not present

## 2020-07-24 MED FILL — ATORVASTATIN CALCIUM 10 MG: 10 | 90 days supply | Qty: 90 | Fill #1

## 2020-07-24 MED FILL — SERTRALINE HCL 100 MG TABS: 100 | 90 days supply | Qty: 135 | Fill #1

## 2020-07-30 ENCOUNTER — Telehealth: Payer: Self-pay | Admitting: Family

## 2020-07-30 DIAGNOSIS — M7032 Other bursitis of elbow, left elbow: Secondary | ICD-10-CM

## 2020-07-30 NOTE — Progress Notes (Signed)
Based on what you shared with me, I feel your condition warrants further evaluation and I recommend that you be seen for a face to face office visit.  Given your symptoms, I recommend being seen face to face as you may need a steroid in the elbow or drained.    NOTE: If you entered your credit card information for this eVisit, you will not be charged. You may see a "hold" on your card for the $35 but that hold will drop off and you will not have a charge processed.   If you are having a true medical emergency please call 911.      For an urgent face to face visit, [place] has five urgent care centers for your convenience:      NEW:  [facility] at [facility] Directions [phone] [address] [place], [place] [address] . 10 am - 6pm Monday - Friday    [facility] [place]) Get Driving Directions [phone] [address] [place], [place] [address] . 10 am to 8 pm Monday-Friday . 12 pm to 8 pm [facility] Urgent Care at [facility] Get Driving Directions [phone] [address], [address] [place], [place] [address] . 8 am to 8 pm Monday-Friday . 9 am to 6 pm Saturday . 11 am to 6 pm Sunday     [facility] Urgent Care at [facility] Get Driving Directions  [phone] [address] [place], [place] [address] . 8 am to 8 pm Monday-Friday . 8 am to 4 pm [facility] Urgent Care at [facility] Directions [phone] [address] [place], [place] [address] . 12 pm to 6 pm Monday-Friday      Your e-visit answers were reviewed by a board certified advanced clinical practitioner to complete your personal care plan.  Thank you for using e-Visits.

## 2020-07-31 ENCOUNTER — Other Ambulatory Visit (HOSPITAL_COMMUNITY): Payer: Self-pay | Admitting: Family Medicine

## 2020-07-31 DIAGNOSIS — M7021 Olecranon bursitis, right elbow: Secondary | ICD-10-CM | POA: Diagnosis not present

## 2020-07-31 MED FILL — predniSONE 20 MG TABS: 20 | 5 days supply | Qty: 10 | Fill #0

## 2020-07-31 MED FILL — DICLOFENAC SOD EC 50 MG TAB: 50 | 10 days supply | Qty: 20 | Fill #0

## 2020-08-24 ENCOUNTER — Other Ambulatory Visit (HOSPITAL_COMMUNITY): Payer: Self-pay | Admitting: Family Medicine

## 2020-08-24 DIAGNOSIS — F419 Anxiety disorder, unspecified: Secondary | ICD-10-CM | POA: Diagnosis not present

## 2020-08-24 DIAGNOSIS — E039 Hypothyroidism, unspecified: Secondary | ICD-10-CM | POA: Diagnosis not present

## 2020-08-24 DIAGNOSIS — E782 Mixed hyperlipidemia: Secondary | ICD-10-CM | POA: Diagnosis not present

## 2020-08-24 DIAGNOSIS — R7401 Elevation of levels of liver transaminase levels: Secondary | ICD-10-CM | POA: Diagnosis not present

## 2020-08-24 DIAGNOSIS — M7021 Olecranon bursitis, right elbow: Secondary | ICD-10-CM | POA: Diagnosis not present

## 2020-08-24 DIAGNOSIS — E669 Obesity, unspecified: Secondary | ICD-10-CM | POA: Diagnosis not present

## 2020-08-24 DIAGNOSIS — I10 Essential (primary) hypertension: Secondary | ICD-10-CM | POA: Diagnosis not present

## 2020-08-24 MED FILL — AMLODIPINE BESYLATE 5 MG TA: 5 | 90 days supply | Qty: 90 | Fill #0

## 2020-08-31 ENCOUNTER — Other Ambulatory Visit (HOSPITAL_COMMUNITY): Payer: Self-pay | Admitting: Internal Medicine

## 2020-08-31 DIAGNOSIS — F902 Attention-deficit hyperactivity disorder, combined type: Secondary | ICD-10-CM | POA: Diagnosis not present

## 2020-08-31 DIAGNOSIS — Z79899 Other long term (current) drug therapy: Secondary | ICD-10-CM | POA: Diagnosis not present

## 2020-08-31 DIAGNOSIS — F419 Anxiety disorder, unspecified: Secondary | ICD-10-CM | POA: Diagnosis not present

## 2020-08-31 MED FILL — D-AMPHETAMINE ER 15 MG CAP: 15 | 30 days supply | Qty: 60 | Fill #0

## 2020-09-13 MED FILL — LEVOTHYROXINE SODIUM 200 MC: 200 | 90 days supply | Qty: 105 | Fill #2

## 2020-09-18 MED FILL — METOPROLOL SUCCINATE ER 25: 25 | 90 days supply | Qty: 90 | Fill #0

## 2020-10-03 DIAGNOSIS — Z713 Dietary counseling and surveillance: Secondary | ICD-10-CM | POA: Diagnosis not present

## 2020-10-03 DIAGNOSIS — R7401 Elevation of levels of liver transaminase levels: Secondary | ICD-10-CM | POA: Diagnosis not present

## 2020-10-03 DIAGNOSIS — Z6829 Body mass index (BMI) 29.0-29.9, adult: Secondary | ICD-10-CM | POA: Diagnosis not present

## 2020-10-03 DIAGNOSIS — E039 Hypothyroidism, unspecified: Secondary | ICD-10-CM | POA: Diagnosis not present

## 2020-10-03 DIAGNOSIS — F988 Other specified behavioral and emotional disorders with onset usually occurring in childhood and adolescence: Secondary | ICD-10-CM | POA: Diagnosis not present

## 2020-10-22 ENCOUNTER — Telehealth: Payer: Self-pay | Admitting: Family

## 2020-10-22 ENCOUNTER — Other Ambulatory Visit: Payer: Self-pay | Admitting: Family

## 2020-10-22 DIAGNOSIS — R197 Diarrhea, unspecified: Secondary | ICD-10-CM

## 2020-10-22 MED ORDER — ONDANSETRON HCL 4 MG PO TABS: 4.0000 mg | ORAL_TABLET | Freq: Three times a day (TID) | ORAL | 0 refills | Status: DC | PRN

## 2020-10-22 NOTE — Progress Notes (Signed)
We are sorry that you are not feeling well.  Here is how we plan to help!  Based on what you have shared with me it looks like you have Acute Infectious Diarrhea.  Most cases of acute diarrhea are due to infections with virus and bacteria and are self-limited conditions lasting less than 14 days.  For your symptoms you may take Imodium 2 mg tablets that are over the counter at your local pharmacy. Take two tablet now and then one after each loose stool up to 6 a day.  Antibiotics are not needed for most people with diarrhea.   Zofran 4 mg 1 tablet every 8 hours as needed for nausea and vomiting    HOME CARE  We recommend changing your diet to help with your symptoms for the next few days.  Drink plenty of fluids that contain water salt and sugar. Sports drinks such as Gatorade may help.   You may try broths, soups, bananas, applesauce, soft breads, mashed potatoes or crackers.   You are considered infectious for as long as the diarrhea continues. Hand washing or use of alcohol based hand sanitizers is recommend.  It is best to stay out of work or school until your symptoms stop.   GET HELP RIGHT AWAY  If you have dark yellow colored urine or do not pass urine frequently you should drink more fluids.    If your symptoms worsen   If you feel like you are going to pass out (faint)  You have a new problem  MAKE SURE YOU   Understand these instructions.  Will watch your condition.  Will get help right away if you are not doing well or get worse.  Your e-visit answers were reviewed by a board certified advanced clinical practitioner to complete your personal care plan.  Depending on the condition, your plan could have included both over the counter or prescription medications.  If there is a problem please reply  once you have received a response from your provider.  Your safety is important to us.  If you have drug allergies check your prescription carefully.    You can  use MyChart to ask questions about today's visit, request a non-urgent call back, or ask for a work or school excuse for 24 hours related to this e-Visit. If it has been greater than 24 hours you will need to follow up with your provider, or enter a new e-Visit to address those concerns.   You will get an e-mail in the next two days asking about your experience.  I hope that your e-visit has been valuable and will speed your recovery. Thank you for using e-visits.  Approximately 5 minutes was spent documenting and reviewing patient's chart.    

## 2020-10-25 MED FILL — SERTRALINE HCL 100 MG TABS: 100 | 90 days supply | Qty: 135 | Fill #0

## 2020-10-25 MED FILL — D-AMPHETAMINE ER 15 MG CAP: 15 | 30 days supply | Qty: 60 | Fill #0

## 2020-10-25 MED FILL — ATORVASTATIN CALCIUM 10 MG: 10 | 90 days supply | Qty: 90 | Fill #2

## 2020-11-27 MED FILL — AMLODIPINE BESYLATE 5 MG TA: 5 | 90 days supply | Qty: 90 | Fill #1

## 2020-12-04 ENCOUNTER — Other Ambulatory Visit (HOSPITAL_COMMUNITY): Payer: Self-pay | Admitting: Physician Assistant

## 2020-12-04 DIAGNOSIS — Z79899 Other long term (current) drug therapy: Secondary | ICD-10-CM | POA: Diagnosis not present

## 2020-12-04 DIAGNOSIS — F419 Anxiety disorder, unspecified: Secondary | ICD-10-CM | POA: Diagnosis not present

## 2020-12-04 DIAGNOSIS — F902 Attention-deficit hyperactivity disorder, combined type: Secondary | ICD-10-CM | POA: Diagnosis not present

## 2020-12-04 MED FILL — D-AMPHETAMINE ER 15 MG CAP: 15 | 30 days supply | Qty: 60 | Fill #0

## 2020-12-08 MED FILL — LEVOTHYROXINE SODIUM 200 MC: 200 | 90 days supply | Qty: 105 | Fill #3

## 2020-12-18 MED FILL — METOPROLOL SUCCINATE ER 25: 25 | 90 days supply | Qty: 90 | Fill #1

## 2020-12-22 MED FILL — D-AMPHETAMINE ER 15 MG CAP: 15 | 30 days supply | Qty: 60 | Fill #0

## 2021-01-22 ENCOUNTER — Other Ambulatory Visit (HOSPITAL_COMMUNITY): Payer: Self-pay

## 2021-01-22 MED FILL — Atorvastatin Calcium Tab 10 MG (Base Equivalent): ORAL | 90 days supply | Qty: 90 | Fill #0 | Status: AC

## 2021-01-23 ENCOUNTER — Other Ambulatory Visit (HOSPITAL_COMMUNITY): Payer: Self-pay

## 2021-01-23 MED ORDER — SERTRALINE HCL 100 MG PO TABS
ORAL_TABLET | ORAL | 0 refills | Status: DC
  Filled 2021-01-23: qty 135, 90d supply, fill #0

## 2021-01-25 ENCOUNTER — Other Ambulatory Visit (HOSPITAL_COMMUNITY): Payer: Self-pay

## 2021-01-25 DIAGNOSIS — E039 Hypothyroidism, unspecified: Secondary | ICD-10-CM | POA: Diagnosis not present

## 2021-01-25 DIAGNOSIS — Z713 Dietary counseling and surveillance: Secondary | ICD-10-CM | POA: Diagnosis not present

## 2021-02-07 DIAGNOSIS — F988 Other specified behavioral and emotional disorders with onset usually occurring in childhood and adolescence: Secondary | ICD-10-CM | POA: Diagnosis not present

## 2021-02-07 DIAGNOSIS — R7401 Elevation of levels of liver transaminase levels: Secondary | ICD-10-CM | POA: Diagnosis not present

## 2021-02-07 DIAGNOSIS — Z6829 Body mass index (BMI) 29.0-29.9, adult: Secondary | ICD-10-CM | POA: Diagnosis not present

## 2021-02-07 DIAGNOSIS — Z713 Dietary counseling and surveillance: Secondary | ICD-10-CM | POA: Diagnosis not present

## 2021-02-07 DIAGNOSIS — R7303 Prediabetes: Secondary | ICD-10-CM | POA: Diagnosis not present

## 2021-02-07 DIAGNOSIS — E039 Hypothyroidism, unspecified: Secondary | ICD-10-CM | POA: Diagnosis not present

## 2021-02-15 ENCOUNTER — Other Ambulatory Visit (HOSPITAL_COMMUNITY): Payer: Self-pay

## 2021-02-20 ENCOUNTER — Other Ambulatory Visit (HOSPITAL_COMMUNITY): Payer: Self-pay

## 2021-02-20 MED ORDER — DEXTROAMPHETAMINE SULFATE ER 15 MG PO CP24
ORAL_CAPSULE | ORAL | 0 refills | Status: DC
  Filled 2021-02-20: qty 60, 30d supply, fill #0

## 2021-02-22 ENCOUNTER — Other Ambulatory Visit (HOSPITAL_COMMUNITY): Payer: Self-pay

## 2021-02-28 ENCOUNTER — Other Ambulatory Visit (HOSPITAL_COMMUNITY): Payer: Self-pay

## 2021-02-28 MED ORDER — AMLODIPINE BESYLATE 5 MG PO TABS
ORAL_TABLET | ORAL | 0 refills | Status: DC
  Filled 2021-02-28: qty 90, 90d supply, fill #0

## 2021-03-05 ENCOUNTER — Other Ambulatory Visit (HOSPITAL_COMMUNITY): Payer: Self-pay

## 2021-03-06 ENCOUNTER — Other Ambulatory Visit (HOSPITAL_COMMUNITY): Payer: Self-pay

## 2021-03-06 DIAGNOSIS — R809 Proteinuria, unspecified: Secondary | ICD-10-CM | POA: Diagnosis not present

## 2021-03-06 DIAGNOSIS — U071 COVID-19: Secondary | ICD-10-CM | POA: Diagnosis not present

## 2021-03-06 DIAGNOSIS — I10 Essential (primary) hypertension: Secondary | ICD-10-CM | POA: Diagnosis not present

## 2021-03-06 DIAGNOSIS — E669 Obesity, unspecified: Secondary | ICD-10-CM | POA: Diagnosis not present

## 2021-03-06 DIAGNOSIS — R7303 Prediabetes: Secondary | ICD-10-CM | POA: Diagnosis not present

## 2021-03-06 MED ORDER — LEVOTHYROXINE SODIUM 200 MCG PO TABS
ORAL_TABLET | ORAL | 0 refills | Status: DC
  Filled 2021-03-06: qty 105, 86d supply, fill #0

## 2021-03-07 ENCOUNTER — Other Ambulatory Visit (HOSPITAL_COMMUNITY): Payer: Self-pay

## 2021-03-09 ENCOUNTER — Other Ambulatory Visit (HOSPITAL_COMMUNITY): Payer: Self-pay

## 2021-03-20 ENCOUNTER — Other Ambulatory Visit (HOSPITAL_COMMUNITY): Payer: Self-pay

## 2021-03-21 ENCOUNTER — Other Ambulatory Visit (HOSPITAL_COMMUNITY): Payer: Self-pay

## 2021-03-21 MED ORDER — METOPROLOL SUCCINATE ER 25 MG PO TB24
ORAL_TABLET | ORAL | 0 refills | Status: DC
  Filled 2021-03-21: qty 90, 90d supply, fill #0

## 2021-04-06 ENCOUNTER — Other Ambulatory Visit (HOSPITAL_COMMUNITY): Payer: Self-pay

## 2021-04-06 DIAGNOSIS — Z79899 Other long term (current) drug therapy: Secondary | ICD-10-CM | POA: Diagnosis not present

## 2021-04-06 DIAGNOSIS — F902 Attention-deficit hyperactivity disorder, combined type: Secondary | ICD-10-CM | POA: Diagnosis not present

## 2021-04-06 MED ORDER — DEXTROAMPHETAMINE SULFATE ER 15 MG PO CP24
ORAL_CAPSULE | ORAL | 0 refills | Status: DC
  Filled 2021-04-06: qty 60, 30d supply, fill #0

## 2021-04-06 MED ORDER — DEXTROAMPHETAMINE SULFATE ER 15 MG PO CP24
ORAL_CAPSULE | ORAL | 0 refills | Status: DC
  Filled 2021-06-07: qty 60, 30d supply, fill #0

## 2021-04-07 ENCOUNTER — Other Ambulatory Visit (HOSPITAL_COMMUNITY): Payer: Self-pay

## 2021-04-07 DIAGNOSIS — H524 Presbyopia: Secondary | ICD-10-CM | POA: Diagnosis not present

## 2021-04-07 DIAGNOSIS — H5203 Hypermetropia, bilateral: Secondary | ICD-10-CM | POA: Diagnosis not present

## 2021-04-07 DIAGNOSIS — H52223 Regular astigmatism, bilateral: Secondary | ICD-10-CM | POA: Diagnosis not present

## 2021-04-24 ENCOUNTER — Other Ambulatory Visit (HOSPITAL_COMMUNITY): Payer: Self-pay

## 2021-04-24 MED ORDER — ATORVASTATIN CALCIUM 10 MG PO TABS
10.0000 mg | ORAL_TABLET | Freq: Every day | ORAL | 1 refills | Status: DC
  Filled 2021-04-24: qty 90, 90d supply, fill #0
  Filled 2021-07-18: qty 90, 90d supply, fill #1

## 2021-04-24 MED FILL — Sertraline HCl Tab 100 MG: ORAL | 90 days supply | Qty: 135 | Fill #0 | Status: AC

## 2021-04-25 ENCOUNTER — Other Ambulatory Visit (HOSPITAL_COMMUNITY): Payer: Self-pay

## 2021-05-29 ENCOUNTER — Other Ambulatory Visit (HOSPITAL_COMMUNITY): Payer: Self-pay

## 2021-05-30 ENCOUNTER — Other Ambulatory Visit (HOSPITAL_COMMUNITY): Payer: Self-pay

## 2021-05-30 MED ORDER — AMLODIPINE BESYLATE 5 MG PO TABS
ORAL_TABLET | ORAL | 0 refills | Status: DC
  Filled 2021-05-30: qty 90, 90d supply, fill #0

## 2021-06-01 ENCOUNTER — Other Ambulatory Visit (HOSPITAL_COMMUNITY): Payer: Self-pay

## 2021-06-01 MED ORDER — LEVOTHYROXINE SODIUM 200 MCG PO TABS
ORAL_TABLET | ORAL | 0 refills | Status: DC
  Filled 2021-06-01: qty 105, 86d supply, fill #0

## 2021-06-07 ENCOUNTER — Other Ambulatory Visit (HOSPITAL_COMMUNITY): Payer: Self-pay

## 2021-06-11 ENCOUNTER — Other Ambulatory Visit (HOSPITAL_COMMUNITY): Payer: Self-pay

## 2021-06-12 ENCOUNTER — Other Ambulatory Visit (HOSPITAL_COMMUNITY): Payer: Self-pay

## 2021-06-12 MED ORDER — METOPROLOL SUCCINATE ER 25 MG PO TB24
25.0000 mg | ORAL_TABLET | Freq: Every day | ORAL | 0 refills | Status: DC
  Filled 2021-06-12: qty 90, 90d supply, fill #0

## 2021-06-18 ENCOUNTER — Other Ambulatory Visit (HOSPITAL_COMMUNITY): Payer: Self-pay

## 2021-06-18 DIAGNOSIS — I10 Essential (primary) hypertension: Secondary | ICD-10-CM | POA: Diagnosis not present

## 2021-06-18 DIAGNOSIS — F419 Anxiety disorder, unspecified: Secondary | ICD-10-CM | POA: Diagnosis not present

## 2021-06-18 DIAGNOSIS — K760 Fatty (change of) liver, not elsewhere classified: Secondary | ICD-10-CM | POA: Diagnosis not present

## 2021-06-18 DIAGNOSIS — E782 Mixed hyperlipidemia: Secondary | ICD-10-CM | POA: Diagnosis not present

## 2021-06-18 DIAGNOSIS — R7303 Prediabetes: Secondary | ICD-10-CM | POA: Diagnosis not present

## 2021-06-18 DIAGNOSIS — E039 Hypothyroidism, unspecified: Secondary | ICD-10-CM | POA: Diagnosis not present

## 2021-06-18 MED ORDER — ATORVASTATIN CALCIUM 10 MG PO TABS
10.0000 mg | ORAL_TABLET | Freq: Every day | ORAL | 3 refills | Status: DC
  Filled 2021-06-18 – 2021-10-26 (×2): qty 90, 90d supply, fill #0
  Filled 2022-01-21: qty 90, 90d supply, fill #1
  Filled 2022-05-22: qty 90, 90d supply, fill #2

## 2021-06-18 MED ORDER — METOPROLOL SUCCINATE ER 25 MG PO TB24
25.0000 mg | ORAL_TABLET | Freq: Every day | ORAL | 1 refills | Status: DC
  Filled 2021-06-18 – 2021-09-14 (×2): qty 90, 90d supply, fill #0
  Filled 2021-12-17: qty 90, 90d supply, fill #1

## 2021-06-18 MED ORDER — AMLODIPINE BESYLATE 5 MG PO TABS
5.0000 mg | ORAL_TABLET | Freq: Every day | ORAL | 1 refills | Status: DC
  Filled 2021-06-18 – 2021-08-28 (×2): qty 90, 90d supply, fill #0
  Filled 2021-11-29: qty 90, 90d supply, fill #1

## 2021-06-18 MED ORDER — SERTRALINE HCL 100 MG PO TABS
ORAL_TABLET | ORAL | 1 refills | Status: DC
  Filled 2021-06-18 – 2021-07-18 (×2): qty 135, 90d supply, fill #0
  Filled 2021-10-17: qty 135, 90d supply, fill #1

## 2021-06-18 MED ORDER — LEVOTHYROXINE SODIUM 200 MCG PO TABS
ORAL_TABLET | ORAL | 1 refills | Status: DC
  Filled 2021-06-18 – 2021-08-28 (×2): qty 105, 90d supply, fill #0
  Filled 2021-12-03: qty 105, 90d supply, fill #1

## 2021-07-18 ENCOUNTER — Other Ambulatory Visit (HOSPITAL_COMMUNITY): Payer: Self-pay

## 2021-07-23 DIAGNOSIS — F988 Other specified behavioral and emotional disorders with onset usually occurring in childhood and adolescence: Secondary | ICD-10-CM | POA: Diagnosis not present

## 2021-07-23 DIAGNOSIS — Z6829 Body mass index (BMI) 29.0-29.9, adult: Secondary | ICD-10-CM | POA: Diagnosis not present

## 2021-07-23 DIAGNOSIS — E039 Hypothyroidism, unspecified: Secondary | ICD-10-CM | POA: Diagnosis not present

## 2021-07-23 DIAGNOSIS — R7401 Elevation of levels of liver transaminase levels: Secondary | ICD-10-CM | POA: Diagnosis not present

## 2021-07-23 DIAGNOSIS — R7303 Prediabetes: Secondary | ICD-10-CM | POA: Diagnosis not present

## 2021-07-23 DIAGNOSIS — Z713 Dietary counseling and surveillance: Secondary | ICD-10-CM | POA: Diagnosis not present

## 2021-08-03 ENCOUNTER — Other Ambulatory Visit (HOSPITAL_COMMUNITY): Payer: Self-pay

## 2021-08-06 ENCOUNTER — Other Ambulatory Visit (HOSPITAL_COMMUNITY): Payer: Self-pay

## 2021-08-07 ENCOUNTER — Other Ambulatory Visit (HOSPITAL_COMMUNITY): Payer: Self-pay

## 2021-08-09 ENCOUNTER — Other Ambulatory Visit (HOSPITAL_COMMUNITY): Payer: Self-pay

## 2021-08-10 ENCOUNTER — Other Ambulatory Visit (HOSPITAL_COMMUNITY): Payer: Self-pay

## 2021-08-10 MED ORDER — DEXTROAMPHETAMINE SULFATE ER 15 MG PO CP24
ORAL_CAPSULE | ORAL | 0 refills | Status: DC
  Filled 2021-08-10: qty 60, 30d supply, fill #0

## 2021-08-28 ENCOUNTER — Other Ambulatory Visit (HOSPITAL_COMMUNITY): Payer: Self-pay

## 2021-08-29 DIAGNOSIS — Z79899 Other long term (current) drug therapy: Secondary | ICD-10-CM | POA: Diagnosis not present

## 2021-08-29 DIAGNOSIS — F902 Attention-deficit hyperactivity disorder, combined type: Secondary | ICD-10-CM | POA: Diagnosis not present

## 2021-08-30 DIAGNOSIS — F902 Attention-deficit hyperactivity disorder, combined type: Secondary | ICD-10-CM | POA: Diagnosis not present

## 2021-09-14 ENCOUNTER — Other Ambulatory Visit (HOSPITAL_COMMUNITY): Payer: Self-pay

## 2021-10-03 ENCOUNTER — Other Ambulatory Visit (HOSPITAL_COMMUNITY): Payer: Self-pay

## 2021-10-05 ENCOUNTER — Other Ambulatory Visit (HOSPITAL_COMMUNITY): Payer: Self-pay

## 2021-10-08 ENCOUNTER — Other Ambulatory Visit (HOSPITAL_COMMUNITY): Payer: Self-pay

## 2021-10-09 ENCOUNTER — Other Ambulatory Visit (HOSPITAL_COMMUNITY): Payer: Self-pay

## 2021-10-09 MED ORDER — DEXTROAMPHETAMINE SULFATE ER 15 MG PO CP24
15.0000 mg | ORAL_CAPSULE | Freq: Two times a day (BID) | ORAL | 0 refills | Status: DC
  Filled 2021-10-09: qty 60, 30d supply, fill #0

## 2021-10-17 ENCOUNTER — Other Ambulatory Visit (HOSPITAL_COMMUNITY): Payer: Self-pay

## 2021-10-23 DIAGNOSIS — R7303 Prediabetes: Secondary | ICD-10-CM | POA: Diagnosis not present

## 2021-10-23 DIAGNOSIS — E039 Hypothyroidism, unspecified: Secondary | ICD-10-CM | POA: Diagnosis not present

## 2021-10-26 ENCOUNTER — Other Ambulatory Visit (HOSPITAL_COMMUNITY): Payer: Self-pay

## 2021-11-29 ENCOUNTER — Other Ambulatory Visit (HOSPITAL_COMMUNITY): Payer: Self-pay

## 2021-11-30 ENCOUNTER — Other Ambulatory Visit (HOSPITAL_COMMUNITY): Payer: Self-pay

## 2021-12-03 ENCOUNTER — Other Ambulatory Visit (HOSPITAL_COMMUNITY): Payer: Self-pay

## 2021-12-03 MED ORDER — DEXTROAMPHETAMINE SULFATE ER 15 MG PO CP24
ORAL_CAPSULE | ORAL | 0 refills | Status: DC
  Filled 2021-12-03: qty 60, 30d supply, fill #0

## 2021-12-17 ENCOUNTER — Other Ambulatory Visit (HOSPITAL_COMMUNITY): Payer: Self-pay

## 2022-01-10 ENCOUNTER — Other Ambulatory Visit (HOSPITAL_COMMUNITY): Payer: Self-pay

## 2022-01-11 ENCOUNTER — Other Ambulatory Visit (HOSPITAL_COMMUNITY): Payer: Self-pay

## 2022-01-11 MED ORDER — SERTRALINE HCL 100 MG PO TABS
150.0000 mg | ORAL_TABLET | Freq: Every day | ORAL | 0 refills | Status: DC
  Filled 2022-01-11: qty 135, 90d supply, fill #0

## 2022-01-18 DIAGNOSIS — E039 Hypothyroidism, unspecified: Secondary | ICD-10-CM | POA: Diagnosis not present

## 2022-01-18 DIAGNOSIS — R7401 Elevation of levels of liver transaminase levels: Secondary | ICD-10-CM | POA: Diagnosis not present

## 2022-01-18 DIAGNOSIS — F988 Other specified behavioral and emotional disorders with onset usually occurring in childhood and adolescence: Secondary | ICD-10-CM | POA: Diagnosis not present

## 2022-01-18 DIAGNOSIS — R7303 Prediabetes: Secondary | ICD-10-CM | POA: Diagnosis not present

## 2022-01-18 DIAGNOSIS — Z6829 Body mass index (BMI) 29.0-29.9, adult: Secondary | ICD-10-CM | POA: Diagnosis not present

## 2022-01-21 ENCOUNTER — Other Ambulatory Visit (HOSPITAL_COMMUNITY): Payer: Self-pay

## 2022-01-24 ENCOUNTER — Other Ambulatory Visit (HOSPITAL_COMMUNITY): Payer: Self-pay

## 2022-01-24 MED ORDER — CYCLOBENZAPRINE HCL 10 MG PO TABS
ORAL_TABLET | ORAL | 0 refills | Status: DC
  Filled 2022-01-24: qty 7, 7d supply, fill #0

## 2022-01-24 MED ORDER — ETODOLAC 400 MG PO TABS
ORAL_TABLET | ORAL | 0 refills | Status: DC
  Filled 2022-01-24: qty 30, 10d supply, fill #0

## 2022-01-28 ENCOUNTER — Other Ambulatory Visit (HOSPITAL_COMMUNITY): Payer: Self-pay

## 2022-01-29 ENCOUNTER — Other Ambulatory Visit (HOSPITAL_COMMUNITY): Payer: Self-pay

## 2022-02-01 ENCOUNTER — Other Ambulatory Visit (HOSPITAL_COMMUNITY): Payer: Self-pay

## 2022-02-01 MED ORDER — DEXTROAMPHETAMINE SULFATE ER 15 MG PO CP24
15.0000 mg | ORAL_CAPSULE | Freq: Two times a day (BID) | ORAL | 0 refills | Status: DC
  Filled 2022-02-01: qty 60, 30d supply, fill #0

## 2022-02-02 ENCOUNTER — Other Ambulatory Visit (HOSPITAL_COMMUNITY): Payer: Self-pay

## 2022-02-04 DIAGNOSIS — R7401 Elevation of levels of liver transaminase levels: Secondary | ICD-10-CM | POA: Diagnosis not present

## 2022-02-04 DIAGNOSIS — E039 Hypothyroidism, unspecified: Secondary | ICD-10-CM | POA: Diagnosis not present

## 2022-02-04 DIAGNOSIS — R7303 Prediabetes: Secondary | ICD-10-CM | POA: Diagnosis not present

## 2022-02-22 ENCOUNTER — Other Ambulatory Visit (HOSPITAL_COMMUNITY): Payer: Self-pay

## 2022-02-22 MED ORDER — AMLODIPINE BESYLATE 5 MG PO TABS
ORAL_TABLET | ORAL | 0 refills | Status: DC
  Filled 2022-02-22 – 2022-03-07 (×2): qty 30, 30d supply, fill #0

## 2022-02-26 ENCOUNTER — Other Ambulatory Visit (HOSPITAL_COMMUNITY): Payer: Self-pay

## 2022-02-26 DIAGNOSIS — F902 Attention-deficit hyperactivity disorder, combined type: Secondary | ICD-10-CM | POA: Diagnosis not present

## 2022-02-26 DIAGNOSIS — Z79899 Other long term (current) drug therapy: Secondary | ICD-10-CM | POA: Diagnosis not present

## 2022-02-26 MED ORDER — DEXTROAMPHETAMINE SULFATE ER 15 MG PO CP24
ORAL_CAPSULE | ORAL | 0 refills | Status: DC
  Filled 2022-05-22: qty 60, 30d supply, fill #0

## 2022-02-26 MED ORDER — DEXTROAMPHETAMINE SULFATE ER 15 MG PO CP24: ORAL_CAPSULE | ORAL | 0 refills | Status: DC

## 2022-02-26 MED ORDER — DEXTROAMPHETAMINE SULFATE ER 15 MG PO CP24
ORAL_CAPSULE | ORAL | 0 refills | Status: DC
  Filled 2022-04-01: qty 60, 30d supply, fill #0

## 2022-02-27 ENCOUNTER — Other Ambulatory Visit (HOSPITAL_COMMUNITY): Payer: Self-pay

## 2022-03-06 ENCOUNTER — Other Ambulatory Visit (HOSPITAL_COMMUNITY): Payer: Self-pay

## 2022-03-07 ENCOUNTER — Other Ambulatory Visit (HOSPITAL_COMMUNITY): Payer: Self-pay

## 2022-03-12 ENCOUNTER — Other Ambulatory Visit (HOSPITAL_COMMUNITY): Payer: Self-pay

## 2022-03-12 MED ORDER — LEVOTHYROXINE SODIUM 200 MCG PO TABS
ORAL_TABLET | ORAL | 0 refills | Status: DC
  Filled 2022-03-12: qty 100, 90d supply, fill #0

## 2022-03-14 ENCOUNTER — Other Ambulatory Visit (HOSPITAL_COMMUNITY): Payer: Self-pay

## 2022-03-19 ENCOUNTER — Other Ambulatory Visit (HOSPITAL_COMMUNITY): Payer: Self-pay

## 2022-03-20 ENCOUNTER — Other Ambulatory Visit (HOSPITAL_COMMUNITY): Payer: Self-pay

## 2022-03-20 MED ORDER — METOPROLOL SUCCINATE ER 25 MG PO TB24
25.0000 mg | ORAL_TABLET | Freq: Every day | ORAL | 0 refills | Status: DC
  Filled 2022-03-20: qty 90, 90d supply, fill #0

## 2022-04-01 ENCOUNTER — Other Ambulatory Visit (HOSPITAL_COMMUNITY): Payer: Self-pay

## 2022-04-01 DIAGNOSIS — E782 Mixed hyperlipidemia: Secondary | ICD-10-CM | POA: Diagnosis not present

## 2022-04-01 DIAGNOSIS — E669 Obesity, unspecified: Secondary | ICD-10-CM | POA: Diagnosis not present

## 2022-04-01 DIAGNOSIS — E039 Hypothyroidism, unspecified: Secondary | ICD-10-CM | POA: Diagnosis not present

## 2022-04-01 DIAGNOSIS — I10 Essential (primary) hypertension: Secondary | ICD-10-CM | POA: Diagnosis not present

## 2022-04-01 DIAGNOSIS — E1169 Type 2 diabetes mellitus with other specified complication: Secondary | ICD-10-CM | POA: Diagnosis not present

## 2022-04-01 DIAGNOSIS — F419 Anxiety disorder, unspecified: Secondary | ICD-10-CM | POA: Diagnosis not present

## 2022-04-01 MED ORDER — TRIAZOLAM 0.25 MG PO TABS
ORAL_TABLET | ORAL | 0 refills | Status: DC
  Filled 2022-04-01: qty 2, 1d supply, fill #0

## 2022-04-01 MED ORDER — METOPROLOL SUCCINATE ER 25 MG PO TB24
25.0000 mg | ORAL_TABLET | Freq: Every day | ORAL | 1 refills | Status: DC
  Filled 2022-04-01 – 2022-06-17 (×2): qty 90, 90d supply, fill #0
  Filled 2022-09-09: qty 90, 90d supply, fill #1

## 2022-04-01 MED ORDER — AMLODIPINE BESYLATE 5 MG PO TABS
ORAL_TABLET | ORAL | 1 refills | Status: DC
  Filled 2022-04-01: qty 90, 90d supply, fill #0

## 2022-04-01 MED ORDER — LEVOTHYROXINE SODIUM 200 MCG PO TABS
ORAL_TABLET | ORAL | 3 refills | Status: AC
  Filled 2022-06-10: qty 100, 90d supply, fill #0
  Filled 2022-09-09: qty 100, 90d supply, fill #1
  Filled 2022-12-17: qty 100, 90d supply, fill #2

## 2022-04-01 MED ORDER — SERTRALINE HCL 100 MG PO TABS
150.0000 mg | ORAL_TABLET | Freq: Every day | ORAL | 1 refills | Status: DC
  Filled 2022-04-01: qty 135, 90d supply, fill #0

## 2022-04-01 MED ORDER — ATORVASTATIN CALCIUM 10 MG PO TABS
ORAL_TABLET | ORAL | 3 refills | Status: DC
  Filled 2022-04-01: qty 90, 90d supply, fill #0

## 2022-04-02 ENCOUNTER — Other Ambulatory Visit (HOSPITAL_COMMUNITY): Payer: Self-pay

## 2022-04-04 ENCOUNTER — Other Ambulatory Visit (HOSPITAL_COMMUNITY): Payer: Self-pay

## 2022-04-04 MED ORDER — ATORVASTATIN CALCIUM 20 MG PO TABS
ORAL_TABLET | ORAL | 1 refills | Status: DC
  Filled 2022-04-04: qty 90, 90d supply, fill #0

## 2022-04-09 DIAGNOSIS — H5203 Hypermetropia, bilateral: Secondary | ICD-10-CM | POA: Diagnosis not present

## 2022-04-09 DIAGNOSIS — Z0101 Encounter for examination of eyes and vision with abnormal findings: Secondary | ICD-10-CM | POA: Diagnosis not present

## 2022-04-09 DIAGNOSIS — H47093 Other disorders of optic nerve, not elsewhere classified, bilateral: Secondary | ICD-10-CM | POA: Diagnosis not present

## 2022-04-09 DIAGNOSIS — H52211 Irregular astigmatism, right eye: Secondary | ICD-10-CM | POA: Diagnosis not present

## 2022-04-09 DIAGNOSIS — H524 Presbyopia: Secondary | ICD-10-CM | POA: Diagnosis not present

## 2022-04-09 DIAGNOSIS — H52213 Irregular astigmatism, bilateral: Secondary | ICD-10-CM | POA: Diagnosis not present

## 2022-04-26 ENCOUNTER — Other Ambulatory Visit (HOSPITAL_COMMUNITY): Payer: Self-pay

## 2022-04-26 MED ORDER — ATORVASTATIN CALCIUM 10 MG PO TABS
ORAL_TABLET | ORAL | 1 refills | Status: DC
  Filled 2022-04-26 – 2022-07-16 (×2): qty 90, 90d supply, fill #0
  Filled 2022-10-30: qty 90, 90d supply, fill #1

## 2022-05-16 ENCOUNTER — Other Ambulatory Visit (HOSPITAL_COMMUNITY): Payer: Self-pay

## 2022-05-16 DIAGNOSIS — H47293 Other optic atrophy, bilateral: Secondary | ICD-10-CM | POA: Diagnosis not present

## 2022-05-16 DIAGNOSIS — H53433 Sector or arcuate defects, bilateral: Secondary | ICD-10-CM | POA: Diagnosis not present

## 2022-05-16 DIAGNOSIS — H35033 Hypertensive retinopathy, bilateral: Secondary | ICD-10-CM | POA: Diagnosis not present

## 2022-05-16 DIAGNOSIS — H25813 Combined forms of age-related cataract, bilateral: Secondary | ICD-10-CM | POA: Diagnosis not present

## 2022-05-16 MED ORDER — THIAMINE HCL 100 MG PO TABS: 100.0000 mg | ORAL_TABLET | Freq: Every day | ORAL | 6 refills | Status: DC

## 2022-05-16 MED ORDER — FOLIC ACID 1 MG PO TABS
ORAL_TABLET | ORAL | 0 refills | Status: DC
  Filled 2022-05-16: qty 90, 90d supply, fill #0

## 2022-05-16 MED ORDER — BRIMONIDINE TARTRATE 0.2 % OP SOLN
1.0000 [drp] | Freq: Two times a day (BID) | OPHTHALMIC | 6 refills | Status: DC
  Filled 2022-05-16: qty 15, 90d supply, fill #0
  Filled 2022-09-05: qty 15, 90d supply, fill #1
  Filled 2023-01-21: qty 15, 90d supply, fill #2

## 2022-05-17 ENCOUNTER — Other Ambulatory Visit (HOSPITAL_COMMUNITY): Payer: Self-pay

## 2022-05-17 ENCOUNTER — Encounter (INDEPENDENT_AMBULATORY_CARE_PROVIDER_SITE_OTHER): Payer: Self-pay | Admitting: Ophthalmology

## 2022-05-17 ENCOUNTER — Ambulatory Visit (INDEPENDENT_AMBULATORY_CARE_PROVIDER_SITE_OTHER): Payer: 59 | Admitting: Ophthalmology

## 2022-05-17 DIAGNOSIS — H2513 Age-related nuclear cataract, bilateral: Secondary | ICD-10-CM

## 2022-05-17 DIAGNOSIS — H4720 Unspecified optic atrophy: Secondary | ICD-10-CM | POA: Diagnosis not present

## 2022-05-17 DIAGNOSIS — I10 Essential (primary) hypertension: Secondary | ICD-10-CM

## 2022-05-17 DIAGNOSIS — H35033 Hypertensive retinopathy, bilateral: Secondary | ICD-10-CM | POA: Diagnosis not present

## 2022-05-17 NOTE — Progress Notes (Signed)
[facility] Note  05/17/2022    CHIEF COMPLAINT Patient presents for Retina Evaluation  HISTORY OF PRESENT ILLNESS: Cheryl Hull is a 46 y.o. female who presents to the clinic today for:   HPI     Retina Evaluation   In both eyes.  This started 6 months ago.  Duration of 6 months.  Associated Symptoms Floaters.  Negative for Flashes, Distortion, Blind Spot, Pain, Redness, Photophobia, Glare, Trauma, Scalp Tenderness, Jaw Claudication, Shoulder/Hip pain, Fever, Weight Loss and Fatigue.  Context:  computer work and night driving.  Treatments tried include no treatments.  I, the attending physician,  performed the HPI with the patient and updated documentation appropriately.        Comments   Patient states vision blurred for computer and night driving. 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 Patient has thyroid disease and has been on medication since the mid 1990's. Has occasional floaters, no flashes. Patient has noticed a decline in peripheral vision. Sometimes she can't see people coming from the side. Was prescribed brimonidine bid OU, but hasn't picked up RX from pharmacy yet.       Last edited by Bernarda Caffey, MD on 05/17/2022 12:26 PM.    Patient saw [facility] and was told patient that she needed a second opinion. She feels that the vision is blurry and having trouble with night vision. She is complaining of glare from lights.    Referring physician: No referring provider defined for this encounter.  HISTORICAL INFORMATION:   Selected notes from the MEDICAL RECORD NUMBER Referred by Dr. Lucianne Lei for retina eval and FA -- ?optic atrophy LEE:  Ocular Hx- PMH-    CURRENT MEDICATIONS: Current Outpatient Medications (Ophthalmic Drugs)  Medication Sig   brimonidine (ALPHAGAN) 0.2 % ophthalmic solution Instill 1 drop into both eyes twice a day   No current facility-administered medications for this visit. (Ophthalmic Drugs)   Current Outpatient  Medications (Other)  Medication Sig   amLODipine (NORVASC) 5 MG tablet Take 1 tablet (5 mg total) by mouth daily.   atorvastatin (LIPITOR) 10 MG tablet Take 1 tablet (10 mg total) by mouth daily.   cyclobenzaprine (FLEXERIL) 10 MG tablet Take 1 tablet by mouth once a day   dextroamphetamine (DEXEDRINE) 15 MG 24 hr capsule Take 1 capsule (15 mg total) by mouth 2 (two) times daily.   folic acid (FOLVITE) 1 MG tablet Take 1 tablet by mouth once a day.   levothyroxine (SYNTHROID) 200 MCG tablet Take 1 tablet by mouth in the morning on an empty stomach, except take 1 & 1/2 tablets 3 days per week as directed   sertraline (ZOLOFT) 100 MG tablet Take 100 mg by mouth every evening.   thiamine (VITAMIN B1) 100 MG tablet Take 1 tablet once a day   triazolam (HALCION) 0.25 MG tablet Take 2 tablets by mouth 1 hour prior to dental treatment.   amLODipine (NORVASC) 5 MG tablet Take 1 tablet by mouth once a day   amLODipine (NORVASC) 5 MG tablet TAKE 1 TABLET BY MOUTH ONCE A DAY   atorvastatin (LIPITOR) 10 MG tablet Take 1 tablet by mouth once a day   atorvastatin (LIPITOR) 10 MG tablet Take 1 tablet (10 mg total) by mouth daily.   atorvastatin (LIPITOR) 10 MG tablet Take 1 tablet by mouth once a day   atorvastatin (LIPITOR) 20 MG tablet Take 1 tablet by mouth once a day   Cholecalciferol (VITAMIN D3) 5000 units TABS  Take 5,000 Units by mouth daily.   dextroamphetamine (DEXEDRINE SPANSULE) 15 MG 24 hr capsule TAKE 1 CAPSULE BY MOUTH 2 TIMES DAILY   dextroamphetamine (DEXEDRINE SPANSULE) 15 MG 24 hr capsule TAKE 1 CAPSULE BY MOUTH TWO TIMES DAILY   dextroamphetamine (DEXEDRINE SPANSULE) 15 MG 24 hr capsule TAKE 1 CAPSULE BY MOUTH TWICE DAILY (MAY BE FILLED 30 DAYS AFTER DATE DATE PRESCRIBED)   dextroamphetamine (DEXEDRINE) 15 MG 24 hr capsule Take 1 capsule by mouth twice a day   dextroamphetamine (DEXEDRINE) 15 MG 24 hr capsule Take 1 capsule by mouth twice daily   dextroamphetamine (DEXEDRINE) 15 MG 24 hr  capsule Take 1 capsule by mouth twice a day; may be filled 30 days after date prescribed   dextroamphetamine (DEXEDRINE) 15 MG 24 hr capsule Take 1 capsule by mouth twice a day; may be filled 60 days after date prescribed   dextroamphetamine (DEXEDRINE) 15 MG 24 hr capsule Take 1 capsule by mouth twice a day   etodolac (LODINE) 400 MG tablet Take 1 tablet by mouth every 8 hours as needed for pain (Patient not taking: Reported on 05/17/2022)   LARIN FE 1.5/30 1.5-30 MG-MCG tablet Take 1 tablet by mouth daily.   levothyroxine (SYNTHROID) 200 MCG tablet Take 1 tablet in the morning on an empty stomach, except take 1 &1/2 tablets 1 day per week as directed   levothyroxine (SYNTHROID) 200 MCG tablet TAKE 1 TABLET BY MOUTH EVERY MORNING ON AN EMPTY STOMCH FOR 4 DAYS OF THE WEEK AND 1 & 1/2 TABLETS THE OTHER 3 DAYS.   levothyroxine (SYNTHROID, LEVOTHROID) 88 MCG tablet Take 88 mcg by mouth daily.   lisdexamfetamine (VYVANSE) 20 MG capsule Take 20 mg by mouth daily.   lisinopril (PRINIVIL,ZESTRIL) 5 MG tablet Take 5 mg by mouth daily.   LORazepam (ATIVAN) 0.5 MG tablet Take 0.5 mg by mouth 2 (two) times daily as needed.   metoprolol succinate (TOPROL-XL) 25 MG 24 hr tablet Take 1 tablet by mouth once a day   sertraline (ZOLOFT) 100 MG tablet Take 1 & 1/2 tablets by mouth daily   sertraline (ZOLOFT) 100 MG tablet TAKE 1&1/2 TABLETS BY MOUTH ONCE A DAY   sertraline (ZOLOFT) 100 MG tablet take 1 and 1/2 tablets by mouth once daily   simvastatin (ZOCOR) 20 MG tablet Take 20 mg by mouth every evening.   VASCEPA 1 g CAPS Take 1 g by mouth 2 (two) times daily.    No current facility-administered medications for this visit. (Other)   REVIEW OF SYSTEMS: ROS   Positive for: Endocrine, Cardiovascular, Eyes Negative for: Constitutional, Gastrointestinal, Neurological, Skin, Genitourinary, Musculoskeletal, HENT, Respiratory, Psychiatric, Allergic/Imm, Heme/Lymph Last edited by Roselee Nova D, COT on 05/17/2022   9:26 AM.     ALLERGIES Allergies  Allergen Reactions   Lisinopril Swelling   PAST MEDICAL HISTORY Past Medical History:  Diagnosis Date   Hypertension    Thyroid disease    History reviewed. No pertinent surgical history.  FAMILY HISTORY Family History  Problem Relation Age of Onset   Diabetes Mother    Diabetes Father    Diabetes Sister    Glaucoma Maternal Grandmother    Diabetes Maternal Grandfather     SOCIAL HISTORY Social History   Tobacco Use   Smoking status: Unknown  Substance Use Topics   Alcohol use: No   Drug use: No       OPHTHALMIC EXAM:  Base Eye Exam     Visual Acuity (Snellen - Linear)  Right Left   Dist cc 20/20 20/20    Correction: Glasses         Tonometry (Tonopen, 9:35 AM)       Right Left   Pressure 12 09         Pupils       Dark Light Shape React APD   Right 3 2.5 Round Minimal None   Left 3 2.5 Round Minimal None         Visual Fields (Counting fingers)       Left Right    Full Full         Extraocular Movement       Right Left    Full, Ortho Full, Ortho         Neuro/Psych     Oriented x3: Yes   Mood/Affect: Normal         Dilation     Both eyes: 1.0% Mydriacyl, 2.5% Phenylephrine @ 9:35 AM           Slit Lamp and Fundus Exam     External Exam       Right Left   External Normal Normal         Slit Lamp Exam       Right Left   Lids/Lashes Dermatochalasis - upper lid Dermatochalasis - upper lid   Conjunctiva/Sclera White and quiet White and quiet   Cornea Clear Clear   Anterior Chamber Deep and quiet Deep and quiet   Iris Round and dilated Round and dilated   Lens 1-2 +Nuclear sclerosis, 1-2+Cortical cataract 1-2 +Nuclear sclerosis, 1-2+Cortical cataract   Anterior Vitreous Normal Normal         Fundus Exam       Right Left   Disc 2+ Pallor, Sharp rim, fine vessels IN quad, mild PPA 2+ Pallor, Sharp rim, mild PPA   C/D Ratio 0.1 0.1   Macula Blunted foveal  reflex, punctate refractile deposits, RPE Mottling Flat, Good foveal reflex, RPE mottling, No heme or edema   Vessels Vascular attenuation, Copper wiring, AV crossing changes Vascular attenuation, Copper wiring   Periphery Attached; no heme; mild pigmented cystoid degeneration inferiorly Attached; no heme; mild pigmented cystoid degeneration inferiorly           Refraction     Wearing Rx       Sphere Cylinder Axis Add   Right +1.25 +0.50 180 +1.25   Left +1.50 Sphere  +1.25         Manifest Refraction       Sphere Cylinder Dist VA   Right +1.00 Sphere 20/20-2   Left +2.00 Sphere 20/20            IMAGING AND PROCEDURES  Imaging and Procedures for 05/17/2022  OCT, Retina - OU - Both Eyes       Right Eye Quality was good. Central Foveal Thickness: 235. Progression has no prior data. Findings include normal foveal contour, no IRF, no SRF (Mild peripheral ellipsoid thinning caught on widefield).   Left Eye Quality was good. Central Foveal Thickness: 248. Progression has no prior data. Findings include normal foveal contour, no IRF, no SRF.   Notes *Images captured and stored on drive  Diagnosis / Impression:  OD: NFP; no IRF/SRF; Mild peripheral ellipsoid thinning, temporal periphery -- caught on widefield OS: NFP; no IRF/SRF   Clinical management:  See below  Abbreviations: NFP - Normal foveal profile. CME - cystoid macular edema. PED - pigment epithelial detachment. IRF -  intraretinal fluid. SRF - subretinal fluid. EZ - ellipsoid zone. ERM - epiretinal membrane. ORA - outer retinal atrophy. ORT - outer retinal tubulation. SRHM - subretinal hyper-reflective material. IRHM - intraretinal hyper-reflective material      Fluorescein Angiography Optos (Transit OD)       Right Eye Progression has no prior data. Early phase findings include normal observations. Mid/Late phase findings include staining (Mild late hyperfluorescent staining of disc, no leakage).    Left Eye Progression has no prior data. Early phase findings include normal observations. Mid/Late phase findings include staining (Mild late hyperfluorescent staining of disc, no leakage).   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 Nuclear sclerotic cataract of both eyes  H25.13     3. Hypertensive retinopathy of both eyes  H35.033 OCT, Retina - OU - Both Eyes    Fluorescein Angiography Optos (Transit OD)    4. Essential hypertension  I10       1. Optic atrophy OU - referred by Dr. Zenaida Niece who noted pale discs OU and constricted HVF OU - BCVA 20/20 OU - agree with Dr. Raeanne Gathers exam -- fundus exam essentially normal -- just disc pallor OU          - OCT OD: Mild peripheral  ellipsoid thinning caught on widefield, OS NFP; no IRF/SRF    - FA (07.28.23). OU: Mild late hyperfluorescent staining of disc (OD>OS), no leakage          - IOP OD 12, OS 09  - May have a family history with mother of eye issues.  - discussed findings, potential further work up  - Recommend further Neuro-ophth work up per Dr. Zenaida Niece - no interventions indicated or recommended from a retina standpoint  - pt can f/u here PRN   2,3. Hypertensive retinopathy OU - discussed importance of tight BP control - monitor for now  4. Mixed Cataract OU (early stage) - The symptoms of cataract, surgical options, and treatments and risks were discussed with patient. - discussed diagnosis and progression - under the expert management of Dr. Zenaida Niece - monitor  Ophthalmic Meds Ordered this visit:  No orders of the defined types were placed in this encounter.    Return if symptoms worsen or fail to  improve.  There are no Patient Instructions on file for this visit.   Explained the diagnoses, plan, and follow up with the patient and they expressed understanding.  Patient expressed understanding of the importance of proper follow up care.   This document serves as a record of services personally performed by Karie Chimera, MD, PhD. It was created on their behalf by Gerilyn Nestle, COT an ophthalmic technician. The creation of this record is the provider's dictation and/or activities during the visit.    Electronically signed by:  Gerilyn Nestle, COT  05/17/22 12:32 PM  Karie Chimera, M.D., Ph.D. Diseases & Surgery of the Retina and Vitreous [facility]  I have reviewed the above documentation for accuracy and completeness, and I agree with the above. Karie Chimera, M.D., Ph.D. 05/17/22 12:37 PM  Abbreviations: M myopia (nearsighted); A astigmatism; H hyperopia (farsighted); P presbyopia; Mrx spectacle prescription;  CTL contact lenses; OD right eye; OS left eye; OU both eyes  XT exotropia; ET esotropia; PEK punctate epithelial keratitis; PEE punctate epithelial erosions; DES dry eye syndrome; MGD meibomian gland dysfunction; ATs artificial tears; PFAT's preservative free artificial tears; NSC nuclear sclerotic cataract; PSC posterior subcapsular cataract; ERM epi-retinal membrane; PVD posterior vitreous detachment; RD retinal detachment; DM diabetes mellitus; DR diabetic retinopathy; NPDR non-proliferative diabetic retinopathy; PDR proliferative diabetic retinopathy; CSME clinically significant macular edema; DME diabetic macular edema; dbh dot blot hemorrhages; CWS cotton wool spot; POAG primary open angle glaucoma; C/D cup-to-disc ratio; HVF humphrey visual field; GVF goldmann visual field; OCT optical coherence tomography; IOP intraocular pressure; BRVO Branch retinal vein occlusion; CRVO central retinal vein occlusion; CRAO central retinal artery  occlusion; BRAO branch retinal artery occlusion; RT retinal tear; SB scleral buckle; PPV pars plana vitrectomy; VH Vitreous hemorrhage; PRP panretinal laser photocoagulation; IVK intravitreal kenalog; VMT vitreomacular traction; MH Macular hole;  NVD neovascularization of the disc; NVE neovascularization elsewhere; AREDS age related eye disease study; ARMD age related macular degeneration; POAG primary open angle glaucoma; EBMD epithelial/anterior basement membrane dystrophy; ACIOL anterior chamber intraocular lens; IOL intraocular lens; PCIOL posterior chamber intraocular lens; Phaco/IOL phacoemulsification with intraocular lens placement; PRK photorefractive keratectomy; LASIK laser assisted in situ ) keratomileusis; HTN hypertension; DM diabetes mellitus; COPD chronic obstructive pulmonary disease

## 2022-05-20 ENCOUNTER — Other Ambulatory Visit (HOSPITAL_COMMUNITY): Payer: Self-pay | Admitting: Ophthalmology

## 2022-05-20 DIAGNOSIS — H4720 Unspecified optic atrophy: Secondary | ICD-10-CM

## 2022-05-22 ENCOUNTER — Other Ambulatory Visit (HOSPITAL_COMMUNITY): Payer: Self-pay

## 2022-05-23 ENCOUNTER — Other Ambulatory Visit (HOSPITAL_COMMUNITY): Payer: Self-pay

## 2022-05-23 ENCOUNTER — Ambulatory Visit (HOSPITAL_COMMUNITY)
Admission: RE | Admit: 2022-05-23 | Discharge: 2022-05-23 | Disposition: A | Discharge status: Home / Self Care | Payer: 59 | Source: Ambulatory Visit | Attending: Ophthalmology | Admitting: Ophthalmology

## 2022-05-23 DIAGNOSIS — H547 Unspecified visual loss: Secondary | ICD-10-CM | POA: Diagnosis not present

## 2022-05-23 DIAGNOSIS — H4720 Unspecified optic atrophy: Secondary | ICD-10-CM | POA: Insufficient documentation

## 2022-05-23 DIAGNOSIS — H47092 Other disorders of optic nerve, not elsewhere classified, left eye: Secondary | ICD-10-CM | POA: Diagnosis not present

## 2022-05-23 DIAGNOSIS — G319 Degenerative disease of nervous system, unspecified: Secondary | ICD-10-CM | POA: Diagnosis not present

## 2022-05-23 MED ORDER — AMLODIPINE BESYLATE 5 MG PO TABS
5.0000 mg | ORAL_TABLET | Freq: Every day | ORAL | 1 refills | Status: DC
  Filled 2022-05-23 – 2022-06-21 (×2): qty 90, 90d supply, fill #0
  Filled 2022-09-18: qty 90, 90d supply, fill #1

## 2022-05-23 MED ORDER — DEXTROAMPHETAMINE SULFATE ER 15 MG PO CP24: ORAL_CAPSULE | ORAL | 0 refills | Status: DC

## 2022-05-23 MED ORDER — DEXTROAMPHETAMINE SULFATE ER 15 MG PO CP24
15.0000 mg | ORAL_CAPSULE | Freq: Two times a day (BID) | ORAL | 0 refills | Status: DC
  Filled 2022-07-29: qty 60, 30d supply, fill #0

## 2022-05-23 MED ORDER — DEXTROAMPHETAMINE SULFATE ER 15 MG PO CP24
ORAL_CAPSULE | ORAL | 0 refills | Status: DC
  Filled 2022-05-23 – 2022-06-03 (×2): qty 60, 30d supply, fill #0

## 2022-05-23 MED ORDER — GADOBUTROL 1 MMOL/ML IV SOLN
7.0000 mL | Freq: Once | INTRAVENOUS | Status: AC | PRN
  Administered 2022-05-23: 7 mL via INTRAVENOUS

## 2022-05-24 ENCOUNTER — Other Ambulatory Visit (HOSPITAL_COMMUNITY): Payer: Self-pay

## 2022-05-28 ENCOUNTER — Other Ambulatory Visit (HOSPITAL_COMMUNITY)
Admission: RE | Admit: 2022-05-28 | Discharge: 2022-05-28 | Disposition: A | Discharge status: Home / Self Care | Payer: 59 | Source: Ambulatory Visit | Attending: Ophthalmology | Admitting: Ophthalmology

## 2022-05-28 DIAGNOSIS — H47293 Other optic atrophy, bilateral: Secondary | ICD-10-CM | POA: Diagnosis not present

## 2022-05-28 LAB — FOLATE: Folate: 27.7 ng/mL (ref >5.9)

## 2022-05-28 LAB — SEDIMENTATION RATE: Sed Rate: 43 mm/hr — ABNORMAL HIGH (ref 0–22)

## 2022-05-28 LAB — C-REACTIVE PROTEIN: CRP: 1.8 mg/dL — ABNORMAL HIGH (ref <1.0)

## 2022-05-28 LAB — VITAMIN B12: Vitamin B-12: 164 pg/mL — ABNORMAL LOW (ref 180–914)

## 2022-05-29 LAB — RHEUMATOID FACTOR: Rheumatoid fact SerPl-aCnc: 10.3 IU/mL (ref <14.0)

## 2022-05-29 LAB — ANCA PROFILE
Anti-MPO Antibodies: <0.2 units (ref 0.0–0.9)
Anti-PR3 Antibodies: <0.2 units (ref 0.0–0.9)
Atypical P-ANCA titer: <1:20 {titer}
C-ANCA: <1:20 {titer}
P-ANCA: <1:20 {titer}

## 2022-05-29 LAB — ANA W/REFLEX: Anti Nuclear Antibody (ANA): NEGATIVE

## 2022-05-29 LAB — ANGIOTENSIN CONVERTING ENZYME: Angiotensin-Converting Enzyme: 63 U/L (ref 14–82)

## 2022-05-29 LAB — MISC LABCORP TEST (SEND OUT): Labcorp test code: 505310

## 2022-05-29 LAB — RPR: RPR Ser Ql: NONREACTIVE

## 2022-05-29 LAB — N-METHYL-D-ASPARTATE RECPT.IGG: N-methyl-D-Aspartate Recpt.IgG: NEGATIVE

## 2022-05-30 LAB — HEAVY METALS, BLOOD
Arsenic: 2 ug/L (ref 0–9)
Lead: <1 ug/dL (ref 0.0–3.4)
Mercury: <1 ug/L (ref 0.0–14.9)

## 2022-05-30 LAB — VITAMIN B6: Vitamin B6: 3.3 ug/L — ABNORMAL LOW (ref 3.4–65.2)

## 2022-05-30 LAB — VITAMIN B1: Vitamin B1 (Thiamine): 142.5 nmol/L (ref 66.5–200.0)

## 2022-05-31 LAB — COPPER, SERUM: Copper: 110 ug/dL (ref 80–158)

## 2022-05-31 LAB — MISC LABCORP TEST (SEND OUT)
Labcorp test code: 123220
Labcorp test code: 164226
Labcorp test code: 70115

## 2022-05-31 LAB — ZINC: Zinc: 62 ug/dL (ref 44–115)

## 2022-06-03 ENCOUNTER — Other Ambulatory Visit (HOSPITAL_COMMUNITY): Payer: Self-pay

## 2022-06-04 ENCOUNTER — Other Ambulatory Visit (HOSPITAL_COMMUNITY): Payer: Self-pay

## 2022-06-10 ENCOUNTER — Other Ambulatory Visit (HOSPITAL_COMMUNITY): Payer: Self-pay

## 2022-06-12 ENCOUNTER — Encounter: Payer: Self-pay | Admitting: Neurology

## 2022-06-12 ENCOUNTER — Ambulatory Visit: Payer: 59 | Admitting: Neurology

## 2022-06-12 VITALS — BP 123/73 | HR 86 | Ht 64.0 in | Wt 179.8 lb

## 2022-06-12 DIAGNOSIS — R0683 Snoring: Secondary | ICD-10-CM | POA: Diagnosis not present

## 2022-06-12 DIAGNOSIS — G4719 Other hypersomnia: Secondary | ICD-10-CM

## 2022-06-12 DIAGNOSIS — H4720 Unspecified optic atrophy: Secondary | ICD-10-CM

## 2022-06-12 DIAGNOSIS — H539 Unspecified visual disturbance: Secondary | ICD-10-CM | POA: Diagnosis not present

## 2022-06-12 DIAGNOSIS — R700 Elevated erythrocyte sedimentation rate: Secondary | ICD-10-CM

## 2022-06-12 NOTE — Progress Notes (Signed)
[facility]  PATIENT: Cheryl Hull DOB: 02/01/1976  REFERRING DOCTOR OR PCP: Dr. Lucianne Lei (ophthalmology); Dr. Dema Severin (PCP) SOURCE: Patient, notes from Dr. Lucianne Lei, imaging and lab reports, MRI images personally reviewed  _________________________________   HISTORICAL  CHIEF COMPLAINT:  Chief Complaint  Patient presents with   New Patient (Initial Visit)    Rm 16, alone. 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     HISTORY OF PRESENT ILLNESS:  I had the pleasure of seeing your patient, Cheryl Hull, at [facility] for neurologic consultation regarding her optic nerve atrophy and visual disturbance.   She is a 46 year old woman who noted the onset of progressive left visual decline one year ago.  Earlier that year, she had a normal ophthalmologic evaluation.   She went to [facility] around May 2023 and her optometrist noted left eye abnormality and reduced peripheral vision and requested a second opinion.   She went to Dr. Lucianne Lei with [facility].  VA was correctable to 20/20 either eye.    Trace NS of both lenses.     She was noted to have optic nerve pallor bilaterally and moderate attenuated vessels.    She had a workup for NAION and MRI's brain and orbit.    MRI showed left otic nerve atrophy.    Labs showed mildly elevated ESR and CRP.   Depth perception is reduced.   No difficulty iwth gait or balance.  She has no numbness or weakness.  She does not need the bannister on stairs.   Bladder is fine.    She sleeps poorly at night.  Her husband tells her she snores.   She wakes up a lot of night and uses the bathroom (x 2).    She is sleepy during the day.  Vascular Risks:   HTN .  Hyperlipidemia.     No cardiac issues, autoimmune or DM.    EPWORTH SLEEPINESS SCALE  On a scale of 0 - 3 what is the chance of dozing:  Sitting and Reading:   2 Watching TV:    2 Sitting inactive in a public place: 0 Passenger in car for one hour: 0 Lying down to  rest in the afternoon: 3 Sitting and talking to someone: 0 Sitting quietly after lunch:  1 In a car, stopped in traffic:  0  Total (out of 24):     10/24   (mild EDS)   I personally reviewed the MRI of the brain and the orbits.  The MRI of the brain just shows a couple small subcortical T2/FLAIR hyperintense foci.  None of these enhance.  They are most consistent with very minimal chronic microvascular ischemic change related to aging and hypertension.  The MRI of the orbits showed mild left optic nerve atrophy relative to the right.  There were no masses.  Blood work 05/17/2022 showed: mildly elevated ESR of 43.   CRP was also mildly elevated at 1.8.  B12 was low at 164 (180-914 nl).    Recent Results (from the past 2160 hour(s))  Angiotensin converting enzyme     Status: None   Collection Time: 05/28/22 12:40 PM  Result Value Ref Range   Angiotensin-Converting Enzyme 63 14 - 82 U/L    Comment: (NOTE) Performed At: [facility] [place], [place] [id number] Rush Farmer MD ER:[id number]   Vitamin B12     Status: Abnormal   Collection Time: 05/28/22 12:40 PM  Result Value  Ref Range   Vitamin B-12 164 (L) 180 - 914 pg/mL    Comment: (NOTE) This assay is not validated for testing neonatal or myeloproliferative syndrome specimens for Vitamin B12 levels. Performed at [facility], [place] [address], [place], [place] [address]   C-reactive protein     Status: Abnormal   Collection Time: 05/28/22 12:40 PM  Result Value Ref Range   CRP 1.8 (H) <1.0 mg/dL    Comment: Performed at [facility] [address], [place], [place] [address]  Copper, serum     Status: None   Collection Time: 05/28/22 12:40 PM  Result Value Ref Range   Copper 110 80 - 158 ug/dL    Comment: (NOTE) This test was developed and its performance characteristics determined by [facility]. It has not been cleared or approved by the Food and Drug Administration.                                 Detection Limit = 5 Performed At: [facility] [place], [place] [id number] Rush Farmer MD KX:[id number]   Sedimentation rate     Status: Abnormal   Collection Time: 05/28/22 12:40 PM  Result Value Ref Range   Sed Rate 43 (H) 0 - 22 mm/hr    Comment: Performed at [facility], [place] [address], [place], [place] [address]  Folate     Status: None   Collection Time: 05/28/22 12:40 PM  Result Value Ref Range   Folate 27.7 >5.9 ng/mL    Comment: RESULTS CONFIRMED BY MANUAL DILUTION Performed at [facility] [address] [place], [place] [address]   Heavy metals, blood     Status: None   Collection Time: 05/28/22 12:40 PM  Result Value Ref Range   Arsenic 2 0 - 9 ug/L    Comment: (NOTE) This test was developed and its performance characteristics determined by [facility]. It has not been cleared or approved by the Food and Drug Administration.                                Detection Limit = 1    Mercury <1.0 0.0 - 14.9 ug/L    Comment: (NOTE) This test was developed and its performance characteristics determined by [facility]. It has not been cleared or approved by the Food and Drug Administration.                        Environmental Exposure:  <15.0                        Occupational Exposure:                         BEI - Inorganic Mercury: 15.0                                Detection Limit =  1.0 Performed At: [facility] [place], [place] [id number] Rush Farmer MD ID:[id number]    Lead <1.0 0.0 - 3.4 ug/dL    Comment: (NOTE) Testing performed by [employer] manager/land agent. This test was developed and its performance characteristics determined by [facility]. It has not been cleared  or approved by the Food and Drug Administration.                          Environmental Exposure:                           WHO Recommendation     <5.0                           Occupational Exposure:                           OSHA Lead Std          40.0                           BEI                    30.0                                Detection Limit =  1.0   Miscellaneous [facility] test (send-out)     Status: None   Collection Time: 05/28/22 12:40 PM  Result Value Ref Range   [facility] test code [id number]    [facility] test name LYME AB     Comment: Performed at [facility], [place] [address], [place], [place] [address]   Misc [facility] result COMMENT     Comment: (NOTE) Test Ordered: [phone] Lyme Disease Serology w/Reflex Lyme Total Antibody CIA        Negative                  BN     Reference Range: Negative                              Lyme antibodies not detected. Reflex testing is not indicated. No laboratory evidence of infection with B. burgdorferi (Lyme disease). Negative results may occur in patients recently infected (less than or equal to 14 days) with B. burgdorferi.  If recent infection is suspected, repeat testing on a new sample collected in 7 to 14 days is recommended. Performed At: [facility] [place], [place] [id number] Rush Farmer MD YP:[id number]   N-methyl-D-Aspartate Recpt.IgG     Status: None   Collection Time: 05/28/22 12:40 PM  Result Value Ref Range   N-methyl-D-Aspartate Recpt.IgG Negative Negative    Comment: (NOTE) This test was developed and its performance characteristics determined by [facility]. It has not been cleared or approved by the Food and Drug Administration. The NIH-sponsored ExTINGUISH Trial (activity and safety of Inebilizumab in anti-NMDAR encephalitis) is actively recruiting patients. You may consider enrolling your patient in the clinical trial if the result of this test is positive. To learn more, or to refer your patient, call 844-4BRAIN5 [phone], study hotline) or see ClinicalTrials.gov (study ID, [id number]). Performed At: [facility] [place], [place] [id number] Rush Farmer MD HA:[id number]   Rheumatoid factor     Status: None   Collection Time: 05/28/22 12:40 PM  Result Value Ref Range   Rhuematoid fact SerPl-aCnc 10.3 <14.0 IU/mL    Comment: (NOTE) Performed At: [facility] [place] [address]  [place], [place] [id number] Rush Farmer MD PZ:[id number]   RPR     Status: None   Collection Time: 05/28/22 12:40 PM  Result Value Ref Range   RPR Ser Ql NON REACTIVE NON REACTIVE    Comment: Performed at [facility] [address], [place], [place] [address]  Vitamin B1     Status: None   Collection Time: 05/28/22 12:40 PM  Result Value Ref Range   Vitamin B1 (Thiamine) 142.5 66.5 - 200.0 nmol/L    Comment: (NOTE) This test was developed and its performance characteristics determined by [facility]. It has not been cleared or approved by the Food and Drug Administration. Performed At: [facility] [place], [place] [id number] Rush Farmer MD GQ:[id number]   Vitamin B6     Status: Abnormal   Collection Time: 05/28/22 12:40 PM  Result Value Ref Range   Vitamin B6 3.3 (L) 3.4 - 65.2 ug/L    Comment: (NOTE) This test was developed and its performance characteristics determined by [facility]. It has not been cleared or approved by the Food and Drug Administration.                             Deficiency:         <3.4                             Marginal:      3.4 - 5.1                             Adequate:           >5.1 Performed At: [facility] [place], [place] [id number] Rush Farmer MD XI:[id number]   Zinc     Status: None   Collection Time: 05/28/22 12:40 PM  Result Value Ref Range   Zinc 62 44 - 115 ug/dL    Comment: (NOTE) This test was developed and its performance characteristics determined by [facility]. It has not been cleared or approved by the Food and Drug Administration.                                Detection Limit =  5 Performed At: [facility] [place], [place] [id number] Rush Farmer MD KW:[id number]   Miscellaneous [facility] test (send-out)     Status: None   Collection Time: 05/28/22 12:40 PM  Result Value Ref Range   [facility] test code [id number]    [facility] test name B3     Comment: Performed at [facility], [place] [address] [place], [place] [address]   Misc [facility] result COMMENT     Comment: (NOTE) Test Ordered: [id number] Vitamin B3 (Niacin+Metabolite) Nicotinamide                   12.0             ng/mL    BN     Reference Range: 5.2-72.1                              This test was developed and its performance characteristics determined by [facility]. It has not been cleared or approved by the Food and Drug Administration. Nicotinic  Acid                 <5.0             ng/mL    BN     Reference Range: 0.0-5.0                               This test was developed and its performance characteristics determined by [facility]. It has not been cleared or approved by the Food and Drug Administration. Performed At: [facility] [place], [place] [id number] Rush Farmer MD TK:[id number]   Miscellaneous [facility] test (send-out)     Status: None   Collection Time: 05/28/22 12:40 PM  Result Value Ref Range   [facility] test code [id number]    [facility] test name B2     Comment: Performed at [facility], [place] [address] [place], [place] [address]   Misc [facility] result COMMENT     Comment: (NOTE) Test Ordered: [phone] Vitamin B2, Whole Blood Vitamin B2, Whole Blood        245              ug/L     BN     Reference Range: 137-370                               This test was developed and its performance characteristics determined by [facility]. It has not been cleared or approved by the Food and Drug Administration. Reference interval reflects Flavin Adenine Dinucleotide (FAD), that accounts for approximately 90% of the  total riboflavin in whole blood. Performed At: [facility] [place], [place] [id number] Rush Farmer MD YW:[id number]   Miscellaneous [facility] test (send-out)     Status: None   Collection Time: 05/28/22 12:40 PM  Result Value Ref Range   [facility] test code [id number]    [facility] test name ANTI MOG    Source ([facility]) BLOOD     Comment: Performed at [facility], [place] [address] [place], [place] [address]   Misc [facility] result COMMENT     Comment: (NOTE) Test Ordered: [phone] Anti-MOG, Serum MOG Antibody, Cell-based IFA   Negative                  BN     Reference Range: Negative                              This test was developed and its performance characteristics determined by [facility]. It has not been cleared or approved by the Food and Drug Administration. Performed At: [facility] [place], [place] [id number] Rush Farmer MD EL:[id number]   ANCA Profile     Status: None   Collection Time: 05/28/22 12:40 PM  Result Value Ref Range   Anti-MPO Antibodies <0.2 0.0 - 0.9 units   Anti-PR3 Antibodies <0.2 0.0 - 0.9 units   C-ANCA <1:20 Neg:<1:20 titer   P-ANCA <1:20 Neg:<1:20 titer    Comment: (NOTE) The presence of positive fluorescence exhibiting P-ANCA or C-ANCA patterns alone is not specific for the diagnosis of Wegener's Granulomatosis (WG) or microscopic polyangiitis. Decisions about treatment should not be based solely on ANCA IFA results.  The International ANCA Group Consensus recommends follow up testing of positive sera with both PR-3  and MPO-ANCA enzyme immunoassays. As many as 5% serum samples are positive only by EIA. Ref. AM J Clin Pathol 1999;111:507-513.    Atypical P-ANCA titer <1:20 Neg:<1:20 titer    Comment: (NOTE) The atypical pANCA pattern has been observed in a significant percentage of patients with ulcerative colitis, primary sclerosing cholangitis and autoimmune hepatitis. Performed  At: [facility] [place], [place] [id number] Rush Farmer MD PI:[id number]   ANA w/Reflex     Status: None   Collection Time: 05/28/22 12:40 PM  Result Value Ref Range   Anti Nuclear Antibody (ANA) Negative Negative    Comment: (NOTE) Performed At: [facility] [place], [place] [id number] Rush Farmer MD NA:[id number]      REVIEW OF SYSTEMS: Constitutional: No fevers, chills, sweats, or change in appetite Eyes: No visual changes, double vision, eye pain Ear, nose and throat: No hearing loss, ear pain, nasal congestion, sore throat Cardiovascular: No chest pain, palpitations Respiratory:  No shortness of breath at rest or with exertion.   No wheezes GastrointestinaI: No nausea, vomiting, diarrhea, abdominal pain, fecal incontinence Genitourinary:  No dysuria, urinary retention or frequency.  No nocturia. Musculoskeletal:  No neck pain, back pain Integumentary: No rash, pruritus, skin lesions Neurological: as above Psychiatric: No depression at this time.  No anxiety Endocrine: No palpitations, diaphoresis, change in appetite, change in weigh or increased thirst Hematologic/Lymphatic:  No anemia, purpura, petechiae. Allergic/Immunologic: No itchy/runny eyes, nasal congestion, recent allergic reactions, rashes  ALLERGIES: Allergies  Allergen Reactions   Lisinopril Swelling    HOME MEDICATIONS:  Current Outpatient Medications:    amLODipine (NORVASC) 5 MG tablet, Take 1 tablet (5 mg total) by mouth daily., Disp: 90 tablet, Rfl: 1   atorvastatin (LIPITOR) 10 MG tablet, Take 1 tablet by mouth once a day, Disp: 90 tablet, Rfl: 1   brimonidine (ALPHAGAN) 0.2 % ophthalmic solution, Instill 1 drop into both eyes twice a day, Disp: 15 mL, Rfl: 6   cyanocobalamin (VITAMIN B12) 1000 MCG tablet, Take 1,000 mcg by mouth daily., Disp: , Rfl:    dextroamphetamine (DEXEDRINE) 15 MG 24 hr capsule, Take 1 capsule by mouth twice a day; may be  filled 60 days after date prescribed, Disp: 60 capsule, Rfl: 0   levothyroxine (SYNTHROID) 200 MCG tablet, Take 1 tablet in the morning on an empty stomach, except take 1 &1/2 tablets 1 day per week as directed, Disp: 100 tablet, Rfl: 3   metoprolol succinate (TOPROL-XL) 25 MG 24 hr tablet, Take 1 tablet by mouth once a day, Disp: 90 tablet, Rfl: 1   sertraline (ZOLOFT) 100 MG tablet, take 1 and 1/2 tablets by mouth once daily, Disp: 135 tablet, Rfl: 0   thiamine (VITAMIN B1) 100 MG tablet, Take 1 tablet once a day, Disp: 30 tablet, Rfl: 6  PAST MEDICAL HISTORY: Past Medical History:  Diagnosis Date   Hypertension    Thyroid disease     PAST SURGICAL HISTORY: History reviewed. No pertinent surgical history.  FAMILY HISTORY: Family History  Problem Relation Age of Onset   Diabetes Mother    Diabetes Father    Diabetes Sister    Glaucoma Maternal Grandmother    Diabetes Maternal Grandfather     SOCIAL HISTORY:  Social History   Socioeconomic History   Marital status: Married    Spouse name: Event organiser   Number of children: 3   Years of education: Not on file   Highest education level: High school graduate  Occupational History  Not on file  Tobacco Use   Smoking status: Unknown   Smokeless tobacco: Not on file  Vaping Use   Vaping Use: Never used  Substance and Sexual Activity   Alcohol use: No   Drug use: No   Sexual activity: Not on file  Other Topics Concern   Not on file  Social History Narrative   Lives with husband   R handed   Caffeine:32 oz a day   Social Determinants of Health   Financial Resource Strain: Not on file  Food Insecurity: Not on file  Transportation Needs: Not on file  Physical Activity: Not on file  Stress: Not on file  Social Connections: Not on file  Intimate Partner Violence: Not on file     PHYSICAL EXAM  Vitals:   06/12/22 0843  BP: 123/73  Pulse: 86  Weight: 179 lb 12.8 oz (81.6 kg)  Height: _0  (1.626 m)    Body  mass index is 30.86 kg/m.    General: The patient is well-developed and well-nourished and in no acute distress  HEENT:  Head is [place]/AT.  Sclera are anicteric.  Funduscopic examination showed optic nerve pallor, left worse than right.  Additionally, in both eyes vasculature seemed attenuated.  Neck: No carotid bruits are noted.  The neck is nontender.  Cardiovascular: The heart has a regular rate and rhythm with a normal S1 and S2. There were no murmurs, gallops or rubs.    Skin: Extremities are without rash or  edema.  Musculoskeletal:  Back is nontender  Neurologic Exam  Mental status: The patient is alert and oriented x 3 at the time of the examination. The patient has apparent normal recent and remote memory, with an apparently normal attention span and concentration ability.   Speech is normal.  Cranial nerves: Extraocular movements are full. Pupils are equal, round, and reactive to light and accomodation.  On visual field confrontation testing, no definite deficit was noted.  Color vision was metric.Marland Kitchen There is good facial sensation to soft touch bilaterally.Facial strength is normal.  Trapezius and sternocleidomastoid strength is normal. No dysarthria is noted.  The tongue is midline, and the patient has symmetric elevation of the soft palate. No obvious hearing deficits are noted.  Motor:  Muscle bulk is normal.   Tone is normal. Strength is  5 / 5 in all 4 extremities.   Sensory: Sensory testing is intact to pinprick, soft touch and vibration sensation in all 4 extremities.  Coordination: Cerebellar testing reveals good finger-nose-finger and heel-to-shin bilaterally.  Gait and station: Station is normal.   Gait is normal. Tandem gait is normal. Romberg is negative.   Reflexes: Deep tendon reflexes are symmetric and 3 at the knees and 2 elsewhere..   Plantar responses are flexor.    DIAGNOSTIC DATA (LABS, IMAGING, TESTING) - I reviewed patient records, labs, notes, testing  and imaging myself where available.  No results found for: "WBC", "HGB", "HCT", "MCV", "PLT" No results found for: "NA", "K", "CL", "CO2", "GLUCOSE", "BUN", "CREATININE", "CALCIUM", "PROT", "ALBUMIN", "AST", "ALT", "ALKPHOS", "BILITOT", "GFRNONAA", "GFRAA" No results found for: "CHOL", "HDL", "LDLCALC", "LDLDIRECT", "TRIG", "CHOLHDL" No results found for: "HGBA1C" Lab Results  Component Value Date   VITAMINB12 164 (L) 05/28/2022   No results found for: "TSH"     ASSESSMENT AND PLAN  Optic atrophy of both eyes - Plan: Neuromyelitis optica autoab, IgG, Sedimentation rate, C-reactive protein, Home sleep test  Visual disturbance  Elevated sed rate - Plan: Sedimentation rate, C-reactive  protein  Snoring - Plan: Home sleep test  Excessive daytime sleepiness - Plan: Home sleep test  In summary, Ms. Clayton Lefort is a 46 year old woman with optic atrophy, left greater than right.  The etiology is uncertain.  I think the possibility that this is demyelinating is small.  The few foci on the MRI of the brain are much more consistent with minimal chronic microvascular ischemic change related to age and hypertension.  MS is far less likely.  She did have a blood test for anti-MOG that was negative.  Anti-NMO had been requested but was not completed and I will order this.  The ESR and CRP were both mildly elevated.  Although this could be incidental, I am going to repeat these labs and if they remain elevated request a temporal artery biopsy to assess for arteritic ischemic optic neuropathy.  She would be young for that diagnosis.  Other labs had shown a low vitamin B12.  I do not think it is low enough to explain her symptoms.  She has just started oral supplements.  A repeat test should be done later this year and if not in the normal range B12 shots could be prescribed.  We will also check a home sleep study to determine if she has significant sleep apnea.  She does have snoring and mild excessive  daytime sleepiness.  We will check a home sleep study and consider CPAP if significant.  She will return to see me in about 2 months or sooner based on the results of the studies or if there are new or worsening neurologic symptoms.  If the ESR and CRP are elevated, I will have her see vascular surgery for biopsy of the temporal artery.  Thank you for asking me to see Ms. Free[place].  Please let me know if I can be of further assistance with her or other patients in the future.      Dyneshia Baccam A. Felecia Shelling, MD, [facility] 5/99/2341, 4:43 AM Certified in Neurology, Clinical Neurophysiology, Sleep Medicine and Neuroimaging  [facility] [address] [place], [place] [phone]  Orders Placed This Encounter  Procedures   Neuromyelitis optica autoab, IgG   Sedimentation rate   C-reactive protein   Home sleep test    Standing Status:   Future    Standing Expiration Date:   06/13/2023    Scheduling Instructions:     Snoring, EDS and optic nerve atrophy    Order Specific Question:   Where should this test be performed:    Answer:   [place]

## 2022-06-13 ENCOUNTER — Telehealth: Payer: Self-pay | Admitting: Neurology

## 2022-06-13 DIAGNOSIS — R700 Elevated erythrocyte sedimentation rate: Secondary | ICD-10-CM

## 2022-06-13 DIAGNOSIS — H4720 Unspecified optic atrophy: Secondary | ICD-10-CM

## 2022-06-13 NOTE — Telephone Encounter (Signed)
I spoke with her about lab results.  ESR and CRP rechecks are still elevated.  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  I discussed this with her and the need to have a temporal artery biopsy.  I will go ahead and refer her to vascular surgery for this procedure.  The NMO antibody is still pending but I am more concerned about a AION than NMOSD

## 2022-06-14 LAB — C-REACTIVE PROTEIN: CRP: 13 mg/L — ABNORMAL HIGH (ref 0–10)

## 2022-06-14 LAB — NEUROMYELITIS OPTICA AUTOAB, IGG: NMO IgG Autoantibodies: <1.5 U/mL (ref 0.0–3.0)

## 2022-06-14 LAB — SEDIMENTATION RATE: Sed Rate: 52 mm/hr — ABNORMAL HIGH (ref 0–32)

## 2022-06-17 ENCOUNTER — Other Ambulatory Visit (HOSPITAL_COMMUNITY): Payer: Self-pay

## 2022-06-18 ENCOUNTER — Telehealth: Payer: Self-pay | Admitting: Neurology

## 2022-06-18 ENCOUNTER — Other Ambulatory Visit (HOSPITAL_COMMUNITY): Payer: Self-pay

## 2022-06-18 NOTE — Telephone Encounter (Signed)
Called and spoke with pt. Relayed Dr. Sater's message. She verbalized understanding and appreciation. 

## 2022-06-18 NOTE — Telephone Encounter (Addendum)
Dr. Epimenio Foot- I see lab results came back 06/14/22 (you spoke to pt on 06/13/22). Did you want her to still complete temporal artery biopsy?

## 2022-06-18 NOTE — Telephone Encounter (Signed)
Pt is calling and wants to know if she still needs the Biopsy done. Pt is requesting a call back from nurse

## 2022-06-19 ENCOUNTER — Ambulatory Visit: Payer: 59 | Admitting: Neurology

## 2022-06-19 ENCOUNTER — Other Ambulatory Visit (HOSPITAL_COMMUNITY): Payer: Self-pay

## 2022-06-19 NOTE — Progress Notes (Signed)
Office Note     CC:  Giant Cell arteritis Requesting Provider:  Britt Bottom, MD  HPI: Cheryl Hull is a 46 y.o. (1976-07-29) female presenting at the request of Dr. Felecia Hull to discuss temporal artery biopsy.   Cheryl Hull first appreciated left visual decline in May of this year.  Work-up demonstrated optic nerve pallor bilaterally with moderately attenuated vessels she had a work-up for NAION including MRI of the brain.  This demonstrated left optic nerve atrophy.  Labs demonstrated mildly elevated ESR and CRP, concerning for giant cell arteritis.  On exam today, Cheryl Hull was doing well.  She was skeptical regarding any surgical intervention.  Cheryl Hull denied tenderness to the scalp or temples, jaw pain, fever, fatigue.  She described her vision problems as waxing and waning.   Past Medical History:  Diagnosis Date   Hypertension    Thyroid disease     No past surgical history on file.  Social History   Socioeconomic History   Marital status: Married    Spouse name: Cheryl Hull   Number of children: 3   Years of education: Not on file   Highest education level: High school graduate  Occupational History   Not on file  Tobacco Use   Smoking status: Unknown   Smokeless tobacco: Not on file  Vaping Use   Vaping Use: Never used  Substance and Sexual Activity   Alcohol use: No   Drug use: No   Sexual activity: Not on file  Other Topics Concern   Not on file  Social History Narrative   Lives with husband   R handed   Caffeine:32 oz a day   Social Determinants of Health   Financial Resource Strain: Not on file  Food Insecurity: Not on file  Transportation Needs: Not on file  Physical Activity: Not on file  Stress: Not on file  Social Connections: Not on file  Intimate Partner Violence: Not on file   Family History  Problem Relation Age of Onset   Diabetes Mother    Diabetes Father    Diabetes Sister    Glaucoma Maternal Grandmother    Diabetes Maternal Grandfather      Current Outpatient Medications  Medication Sig Dispense Refill   amLODipine (NORVASC) 5 MG tablet Take 1 tablet (5 mg total) by mouth daily. 90 tablet 1   atorvastatin (LIPITOR) 10 MG tablet Take 1 tablet by mouth once a day 90 tablet 1   brimonidine (ALPHAGAN) 0.2 % ophthalmic solution Instill 1 drop into both eyes twice a day 15 mL 6   cyanocobalamin (VITAMIN B12) 1000 MCG tablet Take 1,000 mcg by mouth daily.     dextroamphetamine (DEXEDRINE) 15 MG 24 hr capsule Take 1 capsule by mouth twice a day; may be filled 60 days after date prescribed 60 capsule 0   levothyroxine (SYNTHROID) 200 MCG tablet Take 1 tablet in the morning on an empty stomach, except take 1 &1/2 tablets 1 day per week as directed 100 tablet 3   metoprolol succinate (TOPROL-XL) 25 MG 24 hr tablet Take 1 tablet by mouth once a day 90 tablet 1   sertraline (ZOLOFT) 100 MG tablet take 1 and 1/2 tablets by mouth once daily 135 tablet 0   thiamine (VITAMIN B1) 100 MG tablet Take 1 tablet once a day 30 tablet 6   No current facility-administered medications for this visit.    Allergies  Allergen Reactions   Lisinopril Swelling     REVIEW OF SYSTEMS:   [  X] denotes positive finding, [ ]  denotes negative finding Cardiac  Comments:  Chest pain or chest pressure:    Shortness of breath upon exertion:    Short of breath when lying flat:    Irregular heart rhythm:        Vascular    Pain in calf, thigh, or hip brought on by ambulation:    Pain in feet at night that wakes you up from your sleep:     Blood clot in your veins:    Leg swelling:         Pulmonary    Oxygen at home:    Productive cough:     Wheezing:         Neurologic    Sudden weakness in arms or legs:     Sudden numbness in arms or legs:     Sudden onset of difficulty speaking or slurred speech:    Temporary loss of vision in one eye:     Problems with dizziness:         Gastrointestinal    Blood in stool:     Vomited blood:          Genitourinary    Burning when urinating:     Blood in urine:        Psychiatric    Major depression:         Hematologic    Bleeding problems:    Problems with blood clotting too easily:        Skin    Rashes or ulcers:        Constitutional    Fever or chills:      PHYSICAL EXAMINATION:  There were no vitals filed for this visit.  General:  WDWN in NAD; vital signs documented above Gait: Not observed HENT: WNL, normocephalic Pulmonary: normal non-labored breathing , without wheezing Cardiac: regular HR Abdomen: soft, NT, no masses Skin: without rashes Vascular Exam/Pulses:  Right Left  Radial 2+ (normal) 2+ (normal)                       Extremities: without ischemic changes Musculoskeletal: no muscle wasting or atrophy  Neurologic: A&O X 3;  No focal weakness or paresthesias are detected Psychiatric:  The pt has Normal affect.   Non-Invasive Vascular Imaging:   none    ASSESSMENT/PLAN: Cheryl Hull is a 46 y.o. female presenting with concern for giant cell arteritis. I had a long conversation with Cheryl Hull regarding temporal artery biopsy.  We discussed that this is an outpatient based procedure which is the gold standard for giant cell arteritis diagnosis.  She is skeptical about going undergoing surgery at this time.  She plans to reach out to Dr. Felecia Hull to discuss the diagnosis further.   I asked Cheryl Hull to call my office as I am happy to perform her surgery when she feels comfortable.  Cheryl John, MD [facility] [phone]

## 2022-06-20 ENCOUNTER — Other Ambulatory Visit (HOSPITAL_COMMUNITY): Payer: Self-pay

## 2022-06-21 ENCOUNTER — Ambulatory Visit: Payer: 59 | Admitting: Vascular Surgery

## 2022-06-21 ENCOUNTER — Encounter: Payer: Self-pay | Admitting: Neurology

## 2022-06-21 ENCOUNTER — Other Ambulatory Visit (HOSPITAL_COMMUNITY): Payer: Self-pay

## 2022-06-21 VITALS — BP 122/81 | HR 80 | Temp 97.9°F | Resp 14 | Ht 64.0 in | Wt 175.0 lb

## 2022-06-21 DIAGNOSIS — H539 Unspecified visual disturbance: Secondary | ICD-10-CM

## 2022-06-21 DIAGNOSIS — H4720 Unspecified optic atrophy: Secondary | ICD-10-CM

## 2022-06-25 DIAGNOSIS — Z1389 Encounter for screening for other disorder: Secondary | ICD-10-CM | POA: Diagnosis not present

## 2022-06-25 DIAGNOSIS — Z130 Encounter for screening for diseases of the blood and blood-forming organs and certain disorders involving the immune mechanism: Secondary | ICD-10-CM | POA: Diagnosis not present

## 2022-06-25 DIAGNOSIS — Z01419 Encounter for gynecological examination (general) (routine) without abnormal findings: Secondary | ICD-10-CM | POA: Diagnosis not present

## 2022-06-25 DIAGNOSIS — Z1231 Encounter for screening mammogram for malignant neoplasm of breast: Secondary | ICD-10-CM | POA: Diagnosis not present

## 2022-07-13 ENCOUNTER — Other Ambulatory Visit (HOSPITAL_COMMUNITY): Payer: Self-pay

## 2022-07-16 ENCOUNTER — Other Ambulatory Visit (HOSPITAL_COMMUNITY): Payer: Self-pay

## 2022-07-18 ENCOUNTER — Other Ambulatory Visit (HOSPITAL_COMMUNITY): Payer: Self-pay

## 2022-07-18 ENCOUNTER — Telehealth: Payer: Self-pay | Admitting: Neurology

## 2022-07-18 MED ORDER — SERTRALINE HCL 100 MG PO TABS
150.0000 mg | ORAL_TABLET | Freq: Every day | ORAL | 0 refills | Status: DC
Start: 2022-07-18 — End: 2022-10-03
  Filled 2022-07-18: qty 135, 90d supply, fill #0

## 2022-07-18 NOTE — Telephone Encounter (Signed)
MAIL OUT HST- [facility] no auth req ref # [id number] on 07/15/22   Scheduled for 07/24/22.

## 2022-07-22 ENCOUNTER — Other Ambulatory Visit (HOSPITAL_COMMUNITY): Payer: Self-pay

## 2022-07-22 DIAGNOSIS — E039 Hypothyroidism, unspecified: Secondary | ICD-10-CM | POA: Diagnosis not present

## 2022-07-22 DIAGNOSIS — E785 Hyperlipidemia, unspecified: Secondary | ICD-10-CM | POA: Diagnosis not present

## 2022-07-22 DIAGNOSIS — E1165 Type 2 diabetes mellitus with hyperglycemia: Secondary | ICD-10-CM | POA: Diagnosis not present

## 2022-07-22 DIAGNOSIS — I10 Essential (primary) hypertension: Secondary | ICD-10-CM | POA: Diagnosis not present

## 2022-07-23 ENCOUNTER — Other Ambulatory Visit (HOSPITAL_COMMUNITY): Payer: Self-pay

## 2022-07-23 MED ORDER — GLUCOSE BLOOD VI STRP
ORAL_STRIP | 1 refills | Status: DC
  Filled 2022-07-23: qty 100, 50d supply, fill #0

## 2022-07-23 MED ORDER — FREESTYLE LANCETS MISC
0 refills | Status: DC
  Filled 2022-07-23: qty 100, 50d supply, fill #0

## 2022-07-23 MED ORDER — FREESTYLE LITE W/DEVICE KIT
PACK | 0 refills | Status: AC
  Filled 2022-07-23: qty 1, 30d supply, fill #0

## 2022-07-23 MED ORDER — ONETOUCH VERIO IQ SYSTEM W/DEVICE KIT
PACK | 0 refills | Status: DC
  Filled 2022-07-23: qty 1, 28d supply, fill #0

## 2022-07-23 MED ORDER — GLUCOSE BLOOD VI STRP
ORAL_STRIP | 0 refills | Status: DC
  Filled 2022-07-23: qty 100, 50d supply, fill #0

## 2022-07-23 MED ORDER — METFORMIN HCL ER 500 MG PO TB24
ORAL_TABLET | ORAL | 1 refills | Status: DC
  Filled 2022-07-23: qty 30, 30d supply, fill #0
  Filled 2022-09-05: qty 30, 30d supply, fill #1

## 2022-07-24 ENCOUNTER — Ambulatory Visit: Payer: 59 | Admitting: Neurology

## 2022-07-24 ENCOUNTER — Other Ambulatory Visit (HOSPITAL_COMMUNITY): Payer: Self-pay

## 2022-07-24 DIAGNOSIS — H4720 Unspecified optic atrophy: Secondary | ICD-10-CM

## 2022-07-24 DIAGNOSIS — G4710 Hypersomnia, unspecified: Secondary | ICD-10-CM | POA: Diagnosis not present

## 2022-07-24 DIAGNOSIS — R0683 Snoring: Secondary | ICD-10-CM

## 2022-07-24 DIAGNOSIS — G4719 Other hypersomnia: Secondary | ICD-10-CM

## 2022-07-26 ENCOUNTER — Other Ambulatory Visit (HOSPITAL_COMMUNITY): Payer: Self-pay

## 2022-07-29 ENCOUNTER — Other Ambulatory Visit (HOSPITAL_COMMUNITY): Payer: Self-pay

## 2022-08-01 NOTE — Progress Notes (Signed)
   [facility]  HOME SLEEP STUDY  STUDY DATE: 07/30/2022 PATIENT NAME: Cheryl Hull DOB: 1976-08-08 MRN: [id number]  ORDERING CLINICIAN: Richard A. Felecia Shelling, MD, PhD REFERRING CLINICIAN: Richard A. Sater, MD. PhD   CLINICAL INFORMATION: 46 year old woman with optic atrophy, snoring and mild excessive daytime sleepiness   IMPRESSION:  Moderate obstructive sleep apnea (pAHI 3% = 16.9/h) No nocturnal hypoxemia Normal sleep efficiency.   RECOMMENDATION: Initiate treatment with AutoPap 5-16 cm H2O with heated humidifier.  Download in 30 to 90 days. Follow-up with Dr. Felecia Shelling.   INTERPRETING PHYSICIAN:   Richard A. Felecia Shelling, MD, PhD, [facility] Certified in Neurology, Clinical Neurophysiology, Sleep Medicine, Pain Medicine and Neuroimaging  [facility] [address] [place], [place] [address] [phone]

## 2022-08-06 ENCOUNTER — Telehealth: Payer: Self-pay | Admitting: *Deleted

## 2022-08-06 DIAGNOSIS — G4733 Obstructive sleep apnea (adult) (pediatric): Secondary | ICD-10-CM

## 2022-08-06 NOTE — Telephone Encounter (Signed)
-----   Message from Britt Bottom, MD sent at 08/05/2022  9:40 PM EDT ----- Regarding: Home sleep study Please let her know that she has moderate sleep apnea (AHI equals 16.9/h)  I recommend that she initiate AutoPap 5-16 cm H2O with heated humidifier.  Download in 30 to 90 days.

## 2022-08-06 NOTE — Telephone Encounter (Signed)
Called and spoke w/ pt about sleep study results per Dr. Garth Bigness note. She verbalized understanding.  Scheduled initial cpap f/u for 10/25/21 at 11:45am with MM,NP. Aware orders sent to [facility]. Their phone # provided to pt: [phone]. She will call them if she does not hear from them in the next 1-2 weeks.  She preferred to keep appt w/ Dr. Felecia Shelling 08/07/22 to further discuss treatment plan.

## 2022-08-07 ENCOUNTER — Encounter: Payer: Self-pay | Admitting: Neurology

## 2022-08-07 ENCOUNTER — Ambulatory Visit: Payer: 59 | Admitting: Neurology

## 2022-08-07 VITALS — BP 126/77 | HR 68 | Ht 64.0 in | Wt 175.8 lb

## 2022-08-07 DIAGNOSIS — G4733 Obstructive sleep apnea (adult) (pediatric): Secondary | ICD-10-CM

## 2022-08-07 DIAGNOSIS — R700 Elevated erythrocyte sedimentation rate: Secondary | ICD-10-CM

## 2022-08-07 DIAGNOSIS — H539 Unspecified visual disturbance: Secondary | ICD-10-CM | POA: Diagnosis not present

## 2022-08-07 DIAGNOSIS — H4720 Unspecified optic atrophy: Secondary | ICD-10-CM | POA: Diagnosis not present

## 2022-08-07 NOTE — Progress Notes (Signed)
[facility]  PATIENT: Cheryl Hull DOB: 04/03/1976  REFERRING DOCTOR OR PCP: Dr. Lucianne Lei (ophthalmology); Dr. Dema Severin (PCP) SOURCE: Patient, notes from Dr. Lucianne Lei, imaging and lab reports, MRI images personally reviewed  _________________________________   HISTORICAL  CHIEF COMPLAINT:  Chief Complaint  Patient presents with   Follow-up    Pt in room #16 and alone. Pt here today sleep study f/u.    HISTORY OF PRESENT ILLNESS:  Cheryl Hull is a 46 y.o. 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  Update 08/07/2022: Since the last visit she has had a home sleep study that showed moderate OSA (AHI 16.9) we have ordered AutoPap 5-16 cm with heated humidifier.  Repeat ESR was slightly higher at 52 and CRP is mildly elevated at 13 (<10 normal).   Both were mildly elevated a month earlier as well.  She reports vision seems the same now as last month's visit.    Depth perception is reduced and she tripped recently cutting leg.  She denies headache or jaw claudication.   No difficulty with gait or balance.  She has no numbness or weakness.  She does not need the bannister on stairs.   Bladder is fine.    She sleeps poorly at night.  Her husband tells her she snores.   She wakes up a lot of night and uses the bathroom (x 2).    She is sleepy during the day.  H/O visual changes: She noted the onset of progressive left visual decline late 2022.  Earlier that year, she had a normal ophthalmologic evaluation.   She went to [facility] around May 2023 and her optometrist noted left eye abnormality and reduced peripheral vision and requested a second opinion.   She went to Dr. Lucianne Lei with [facility].  VA was correctable to 20/20 either eye.    Trace NS of both lenses.     She was noted to have optic nerve pallor bilaterally and moderate attenuated vessels.    She had a workup for NAION and MRI's brain and orbit.    MRI showed left otic nerve atrophy.    Labs  showed mildly elevated ESR and CRP.    Vascular Risks:   HTN .  Hyperlipidemia.     No cardiac issues, autoimmune or DM.    EPWORTH SLEEPINESS SCALE  On a scale of 0 - 3 what is the chance of dozing:  Sitting and Reading:   2 Watching TV:    2 Sitting inactive in a public place: 0 Passenger in car for one hour: 0 Lying down to rest in the afternoon: 3 Sitting and talking to someone: 0 Sitting quietly after lunch:  1 In a car, stopped in traffic:  0  Total (out of 24):     10/24   (mild EDS)   I personally reviewed the MRI of the brain and the orbits.  The MRI of the brain just shows a couple small subcortical T2/FLAIR hyperintense foci.  None of these enhance.  They are most consistent with very minimal chronic microvascular ischemic change related to aging and hypertension.  The MRI of the orbits showed mild left optic nerve atrophy relative to the right.  There were no masses.  Blood work 05/17/2022 showed: mildly elevated ESR of 43.   CRP was also mildly elevated at 1.8.  B12 was low at 164 (180-914 nl).    Recent Results (from the past 2160 hour(s))  Angiotensin converting enzyme  Status: None   Collection Time: 05/28/22 12:40 PM  Result Value Ref Range   Angiotensin-Converting Enzyme 63 14 - 82 U/L    Comment: (NOTE) Performed At: [facility] [place], [place] [id number] Rush Farmer MD ID:[id number]   Vitamin B12     Status: Abnormal   Collection Time: 05/28/22 12:40 PM  Result Value Ref Range   Vitamin B-12 164 (L) 180 - 914 pg/mL    Comment: (NOTE) This assay is not validated for testing neonatal or myeloproliferative syndrome specimens for Vitamin B12 levels. Performed at [facility], [place] [address] [place], [place] [address]   C-reactive protein     Status: Abnormal   Collection Time: 05/28/22 12:40 PM  Result Value Ref Range   CRP 1.8 (H) <1.0 mg/dL    Comment: Performed at [place]  [address], [place], [place] [address]  Copper, serum     Status: None   Collection Time: 05/28/22 12:40 PM  Result Value Ref Range   Copper 110 80 - 158 ug/dL    Comment: (NOTE) This test was developed and its performance characteristics determined by [facility]. It has not been cleared or approved by the Food and Drug Administration.                                Detection Limit = 5 Performed At: [facility] [place], [place] [id number] Rush Farmer MD [id number]   Sedimentation rate     Status: Abnormal   Collection Time: 05/28/22 12:40 PM  Result Value Ref Range   Sed Rate 43 (H) 0 - 22 mm/hr    Comment: Performed at [facility], [place] [address] [place], [place] [address]  Folate     Status: None   Collection Time: 05/28/22 12:40 PM  Result Value Ref Range   Folate 27.7 >5.9 ng/mL    Comment: RESULTS CONFIRMED BY MANUAL DILUTION Performed at [place] [address], [place], [place] [address]   Heavy metals, blood     Status: None   Collection Time: 05/28/22 12:40 PM  Result Value Ref Range   Arsenic 2 0 - 9 ug/L    Comment: (NOTE) This test was developed and its performance characteristics determined by [facility]. It has not been cleared or approved by the Food and Drug Administration.                                Detection Limit = 1    Mercury <1.0 0.0 - 14.9 ug/L    Comment: (NOTE) This test was developed and its performance characteristics determined by [facility]. It has not been cleared or approved by the Food and Drug Administration.                        Environmental Exposure:  <15.0                        Occupational Exposure:                         BEI - Inorganic Mercury: 15.0  Detection Limit =  1.0 Performed At: [facility] [place], [place] [id number] Rush Farmer MD YK:[id number]    Lead <1.0 0.0 - 3.4 ug/dL    Comment:  (NOTE) Testing performed by [employer]duct[employer] Estate manager/land agent. This test was developed and its performance characteristics determined by [facility]. It has not been cleared or approved by the Food and Drug Administration.                          Environmental Exposure:                           WHO Recommendation     <5.0                          Occupational Exposure:                           OSHA Lead Std          40.0                           BEI                    30.0                                Detection Limit =  1.0   Miscellaneous [facility] test (send-out)     Status: None   Collection Time: 05/28/22 12:40 PM  Result Value Ref Range   [facility] test code [id number]    [facility] test name LYME AB     Comment: Performed at [facility], [place] [address] [place], [place] [address]   Misc [facility] result COMMENT     Comment: (NOTE) Test Ordered: [phone] Lyme Disease Serology w/Reflex Lyme Total Antibody CIA        Negative                  BN     Reference Range: Negative                              Lyme antibodies not detected. Reflex testing is not indicated. No laboratory evidence of infection with B. burgdorferi (Lyme disease). Negative results may occur in patients recently infected (less than or equal to 14 days) with B. burgdorferi.  If recent infection is suspected, repeat testing on a new sample collected in 7 to 14 days is recommended. Performed At: [facility] [place], [place] [id number] Rush Farmer MD QA:[id number]   N-methyl-D-Aspartate Recpt.IgG     Status: None   Collection Time: 05/28/22 12:40 PM  Result Value Ref Range   N-methyl-D-Aspartate Recpt.IgG Negative Negative    Comment: (NOTE) This test was developed and its performance characteristics determined by [facility]. It has not been cleared or approved by the Food and Drug Administration. The NIH-sponsored ExTINGUISH Trial (activity and safety  of Inebilizumab in anti-NMDAR encephalitis) is actively recruiting patients. You may consider enrolling your patient in the clinical trial if the result of this test is positive. To learn more, or to refer your patient, call 844-4BRAIN5 684-475-4047, study hotline) or see ClinicalTrials.gov (study ID, [id number]). Performed  At: [facility] [place], [place] [id number] Rush Farmer MD [id number]   Rheumatoid factor     Status: None   Collection Time: 05/28/22 12:40 PM  Result Value Ref Range   Rhuematoid fact SerPl-aCnc 10.3 <14.0 IU/mL    Comment: (NOTE) Performed At: [facility] [place], [place] [id number] Rush Farmer MD JS:[id number]   RPR     Status: None   Collection Time: 05/28/22 12:40 PM  Result Value Ref Range   RPR Ser Ql NON REACTIVE NON REACTIVE    Comment: Performed at [facility], [address], [place], [place] [address]  Vitamin B1     Status: None   Collection Time: 05/28/22 12:40 PM  Result Value Ref Range   Vitamin B1 (Thiamine) 142.5 66.5 - 200.0 nmol/L    Comment: (NOTE) This test was developed and its performance characteristics determined by [facility]. It has not been cleared or approved by the Food and Drug Administration. Performed At: [facility] [place], [place] [id number] Rush Farmer MD EH:[id number]   Vitamin B6     Status: Abnormal   Collection Time: 05/28/22 12:40 PM  Result Value Ref Range   Vitamin B6 3.3 (L) 3.4 - 65.2 ug/L    Comment: (NOTE) This test was developed and its performance characteristics determined by [facility]. It has not been cleared or approved by the Food and Drug Administration.                             Deficiency:         <3.4                             Marginal:      3.4 - 5.1                             Adequate:           >5.1 Performed At: [facility] [place], [place] [id number] Rush Farmer MD  [id number]   Zinc     Status: None   Collection Time: 05/28/22 12:40 PM  Result Value Ref Range   Zinc 62 44 - 115 ug/dL    Comment: (NOTE) This test was developed and its performance characteristics determined by [facility]. It has not been cleared or approved by the Food and Drug Administration.                                Detection Limit = 5 Performed At: [facility] [place], [place] [id number] Rush Farmer MD [id number]   Miscellaneous [facility] test (send-out)     Status: None   Collection Time: 05/28/22 12:40 PM  Result Value Ref Range   [facility] test code [id number]    [facility] test name B3     Comment: Performed at [facility], [place] [address], [place], [place] [address]   Misc [facility] result COMMENT     Comment: (NOTE) Test Ordered: [id number] Vitamin B3 (Niacin+Metabolite) Nicotinamide                   12.0             ng/mL    BN  Reference Range: 5.2-72.1                              This test was developed and its performance characteristics determined by [facility]. It has not been cleared or approved by the Food and Drug Administration. Nicotinic Acid                 <5.0             ng/mL    BN     Reference Range: 0.0-5.0                               This test was developed and its performance characteristics determined by [facility]. It has not been cleared or approved by the Food and Drug Administration. Performed At: [facility] [place], [place] [id number] Rush Farmer MD [id number]   Miscellaneous [facility] test (send-out)     Status: None   Collection Time: 05/28/22 12:40 PM  Result Value Ref Range   [facility] test code [id number]    [facility] test name B2     Comment: Performed at [facility], [place] [address] [place], [place] [address]   Misc [facility] result COMMENT     Comment: (NOTE) Test Ordered: [phone] Vitamin B2, Whole Blood Vitamin B2, Whole Blood        245               ug/L     BN     Reference Range: 137-370                               This test was developed and its performance characteristics determined by [facility]. It has not been cleared or approved by the Food and Drug Administration. Reference interval reflects Flavin Adenine Dinucleotide (FAD), that accounts for approximately 90% of the total riboflavin in whole blood. Performed At: [facility] [place], [place] [id number] Rush Farmer MD [id number]   Miscellaneous [facility] test (send-out)     Status: None   Collection Time: 05/28/22 12:40 PM  Result Value Ref Range   [facility] test code [id number]    [facility] test name ANTI MOG    Source ([facility]) BLOOD     Comment: Performed at [facility], [place] [address], [place],  [address]   Misc [facility] result COMMENT     Comment: (NOTE) Test Ordered: [id number] Anti-MOG, Serum MOG Antibody, Cell-based IFA   Negative                  BN     Reference Range: Negative                              This test was developed and its performance characteristics determined by [facility]. It has not been cleared or approved by the Food and Drug Administration. Performed At: [facility] [place], [place] [id number] Rush Farmer MD JI:[id number]   ANCA Profile     Status: None   Collection Time: 05/28/22 12:40 PM  Result Value Ref Range   Anti-MPO Antibodies <0.2 0.0 - 0.9 units   Anti-PR3 Antibodies <0.2 0.0 - 0.9 units   C-ANCA <1:20 Neg:<1:20 titer   P-ANCA <1:20 Neg:<1:20 titer  Comment: (NOTE) The presence of positive fluorescence exhibiting P-ANCA or C-ANCA patterns alone is not specific for the diagnosis of Wegener's Granulomatosis (WG) or microscopic polyangiitis. Decisions about treatment should not be based solely on ANCA IFA results.  The International ANCA Group Consensus recommends follow up testing of positive sera with both PR-3 and MPO-ANCA enzyme immunoassays.  As many as 5% serum samples are positive only by EIA. Ref. AM J Clin Pathol 1999;111:507-513.    Atypical P-ANCA titer <1:20 Neg:<1:20 titer    Comment: (NOTE) The atypical pANCA pattern has been observed in a significant percentage of patients with ulcerative colitis, primary sclerosing cholangitis and autoimmune hepatitis. Performed At: [facility] [place], [place] [id number] Rush Farmer MD ST:[id number]   ANA w/Reflex     Status: None   Collection Time: 05/28/22 12:40 PM  Result Value Ref Range   Anti Nuclear Antibody (ANA) Negative Negative    Comment: (NOTE) Performed At: [facility] [place], [place] [id number] Rush Farmer MD [id number]   Neuromyelitis optica autoab, IgG     Status: None   Collection Time: 06/12/22  9:54 AM  Result Value Ref Range   NMO IgG Autoantibodies <1.5 0.0 - 3.0 U/mL    Comment:                              Negative:      0.0 - 3.0                              Positive:           >3.0   Sedimentation rate     Status: Abnormal   Collection Time: 06/12/22  9:54 AM  Result Value Ref Range   Sed Rate 52 (H) 0 - 32 mm/hr  C-reactive protein     Status: Abnormal   Collection Time: 06/12/22  9:54 AM  Result Value Ref Range   CRP 13 (H) 0 - 10 mg/L     REVIEW OF SYSTEMS: Constitutional: No fevers, chills, sweats, or change in appetite Eyes: No visual changes, double vision, eye pain Ear, nose and throat: No hearing loss, ear pain, nasal congestion, sore throat Cardiovascular: No chest pain, palpitations Respiratory:  No shortness of breath at rest or with exertion.   No wheezes GastrointestinaI: No nausea, vomiting, diarrhea, abdominal pain, fecal incontinence Genitourinary:  No dysuria, urinary retention or frequency.  No nocturia. Musculoskeletal:  No neck pain, back pain Integumentary: No rash, pruritus, skin lesions Neurological: as above Psychiatric: No depression at this time.  No  anxiety Endocrine: No palpitations, diaphoresis, change in appetite, change in weigh or increased thirst Hematologic/Lymphatic:  No anemia, purpura, petechiae. Allergic/Immunologic: No itchy/runny eyes, nasal congestion, recent allergic reactions, rashes  ALLERGIES: Allergies  Allergen Reactions   Lisinopril Swelling    HOME MEDICATIONS:  Current Outpatient Medications:    amLODipine (NORVASC) 5 MG tablet, Take 1 tablet (5 mg total) by mouth daily., Disp: 90 tablet, Rfl: 1   atorvastatin (LIPITOR) 10 MG tablet, Take 1 tablet by mouth once a day, Disp: 90 tablet, Rfl: 1   Blood Glucose Monitoring Suppl (FREESTYLE LITE) w/Device KIT, Use to check blood sugar 1-2 times a day as directed, Disp: 1 kit, Rfl: 0   Blood Glucose Monitoring Suppl (ONETOUCH VERIO IQ SYSTEM) w/Device KIT, Check blood sugar 1 - 2 times daily as directed,  Disp: 1 kit, Rfl: 0   brimonidine (ALPHAGAN) 0.2 % ophthalmic solution, Instill 1 drop into both eyes twice a day, Disp: 15 mL, Rfl: 6   dextroamphetamine (DEXEDRINE) 15 MG 24 hr capsule, Take 1 capsule (15 mg total) by mouth 2 (two) times daily., Disp: 60 capsule, Rfl: 0   glucose blood test strip, Test 1 - 2 times daily as directed, Disp: 100 strip, Rfl: 1   glucose blood test strip, Use to check blood sugar 1-2 times a day as directed, Disp: 100 each, Rfl: 0   Lancets (FREESTYLE) lancets, Use to check blood sugar 1-2 times a day as directed, Disp: 100 each, Rfl: 0   levothyroxine (SYNTHROID) 200 MCG tablet, Take 1 tablet in the morning on an empty stomach, except take 1 &1/2 tablets 1 day per week as directed, Disp: 100 tablet, Rfl: 3   metoprolol succinate (TOPROL-XL) 25 MG 24 hr tablet, Take 1 tablet by mouth once a day, Disp: 90 tablet, Rfl: 1   sertraline (ZOLOFT) 100 MG tablet, take 1 and 1/2 tablets by mouth once daily, Disp: 135 tablet, Rfl: 0   sertraline (ZOLOFT) 100 MG tablet, Take 1.5 tablets (150 mg total) by mouth daily., Disp: 135 tablet, Rfl: 0  PAST  MEDICAL HISTORY: Past Medical History:  Diagnosis Date   Hypertension    Thyroid disease     PAST SURGICAL HISTORY: History reviewed. No pertinent surgical history.  FAMILY HISTORY: Family History  Problem Relation Age of Onset   Diabetes Mother    Diabetes Father    Diabetes Sister    Glaucoma Maternal Grandmother    Diabetes Maternal Grandfather     SOCIAL HISTORY:  Social History   Socioeconomic History   Marital status: Married    Spouse name: Event organiser   Number of children: 3   Years of education: Not on file   Highest education level: High school graduate  Occupational History   Not on file  Tobacco Use   Smoking status: Unknown   Smokeless tobacco: Not on file  Vaping Use   Vaping Use: Never used  Substance and Sexual Activity   Alcohol use: No   Drug use: No   Sexual activity: Not on file  Other Topics Concern   Not on file  Social History Narrative   Lives with husband   R handed   Caffeine:32 oz a day   Social Determinants of Health   Financial Resource Strain: Not on file  Food Insecurity: Not on file  Transportation Needs: Not on file  Physical Activity: Not on file  Stress: Not on file  Social Connections: Not on file  Intimate Partner Violence: Not on file     PHYSICAL EXAM  Vitals:   08/07/22 0844  BP: 126/77  Pulse: 68  Weight: 175 lb 12.8 oz (79.7 kg)  Height: 5' 4"  (1.626 m)    Body mass index is 30.18 kg/m.    General: The patient is well-developed and well-nourished and in no acute distress   Pharynx is Mallampati 2  HEENT:  Head is [place]/AT.  Sclera are anicteric.  Funduscopic examination showed optic nerve pallor, left worse than right.  Additionally, in both eyes vasculature seemed attenuated.   Can feel TA pulse.     Neck: No carotid bruits are noted.  The neck is nontender.  Neurologic Exam  Mental status: The patient is alert and oriented x 3 at the time of the examination. The patient has apparent normal recent and  remote memory, with an apparently normal attention span and concentration ability.   Speech is normal.  Cranial nerves: Extraocular movements are full. Pupils are equal, round, and reactive to light and accomodation.  Color vision was symmetric.Marland Kitchen There is good facial sensation to soft touch bilaterally.Facial strength is normal.  Trapezius and sternocleidomastoid strength is normal. No dysarthria is noted.  The tongue is midline, and the patient has symmetric elevation of the soft palate. No obvious hearing deficits are noted.  Motor:  Muscle bulk is normal.   Tone is normal. Strength is  5 / 5 in all 4 extremities.   Sensory: Sensory testing is intact to pinprick, soft touch and vibration sensation in all 4 extremities.  Coordination: Cerebellar testing reveals good finger-nose-finger and heel-to-shin bilaterally.  Gait and station: Station is normal.   Gait is normal. Tandem gait is normal. Romberg is negative.   Reflexes: Deep tendon reflexes are symmetric and 3 at the knees and 2 elsewhere..         ASSESSMENT AND PLAN  Optic atrophy of both eyes - Plan: Sedimentation rate, C-reactive protein, Comprehensive metabolic panel, Lipid panel, Microalbumin/Creatinine Ratio, Urine, TSH  Visual disturbance  Elevated sed rate - Plan: Sedimentation rate, C-reactive protein  OSA (obstructive sleep apnea)  Etiology of optic atrophy uncertain.  Most likely nonarteritic.  However, ESR and CRP were slightly elevated.  Repeat ESR, CRP and check lipid panel, TSH, CMP.  If increasing further, reconsider TA biopsy.  Initiate Auto=PAP 5-16 cm we will check download and adjust if needed. B12 supplements orally Rtc 2-3 months, sooner if problems     Kaeden Depaz A. Felecia Shelling, MD, [facility] 29/06/300, 4:99 AM Certified in Neurology, Clinical Neurophysiology, Sleep Medicine and Neuroimaging  [facility] [address] [place], [place] [address] [phone]  Orders Placed This  Encounter  Procedures   Sedimentation rate   C-reactive protein   Comprehensive metabolic panel   Lipid panel   Microalbumin/Creatinine Ratio, Urine   TSH

## 2022-08-09 LAB — COMPREHENSIVE METABOLIC PANEL
ALT: 23 IU/L (ref 0–32)
AST: 54 IU/L — ABNORMAL HIGH (ref 0–40)
Albumin/Globulin Ratio: 1.3 (ref 1.2–2.2)
Albumin: 4.4 g/dL (ref 3.9–4.9)
Alkaline Phosphatase: 150 IU/L — ABNORMAL HIGH (ref 44–121)
BUN/Creatinine Ratio: 17 (ref 9–23)
BUN: 11 mg/dL (ref 6–24)
Bilirubin Total: 0.5 mg/dL (ref 0.0–1.2)
CO2: 26 mmol/L (ref 20–29)
Calcium: 9.8 mg/dL (ref 8.7–10.2)
Chloride: 100 mmol/L (ref 96–106)
Creatinine, Ser: 0.64 mg/dL (ref 0.57–1.00)
Globulin, Total: 3.4 g/dL (ref 1.5–4.5)
Glucose: 129 mg/dL — ABNORMAL HIGH (ref 70–99)
Potassium: 4.3 mmol/L (ref 3.5–5.2)
Sodium: 142 mmol/L (ref 134–144)
Total Protein: 7.8 g/dL (ref 6.0–8.5)
eGFR: 110 mL/min/{1.73_m2} (ref >59)

## 2022-08-09 LAB — LIPID PANEL
Chol/HDL Ratio: 4.5 ratio — ABNORMAL HIGH (ref 0.0–4.4)
Cholesterol, Total: 168 mg/dL (ref 100–199)
HDL: 37 mg/dL — ABNORMAL LOW (ref >39)
LDL Chol Calc (NIH): 104 mg/dL — ABNORMAL HIGH (ref 0–99)
Triglycerides: 155 mg/dL — ABNORMAL HIGH (ref 0–149)
VLDL Cholesterol Cal: 27 mg/dL (ref 5–40)

## 2022-08-09 LAB — TSH: TSH: 0.12 u[IU]/mL — ABNORMAL LOW (ref 0.450–4.500)

## 2022-08-09 LAB — MICROALBUMIN / CREATININE URINE RATIO
Creatinine, Urine: 128.9 mg/dL
Microalb/Creat Ratio: 726 mg/g creat — ABNORMAL HIGH (ref 0–29)
Microalbumin, Urine: 935.6 ug/mL

## 2022-08-09 LAB — C-REACTIVE PROTEIN: CRP: 16 mg/L — ABNORMAL HIGH (ref 0–10)

## 2022-08-09 LAB — SEDIMENTATION RATE: Sed Rate: 60 mm/hr — ABNORMAL HIGH (ref 0–32)

## 2022-08-11 ENCOUNTER — Encounter: Payer: Self-pay | Admitting: Adult Health

## 2022-08-11 ENCOUNTER — Encounter: Payer: Self-pay | Admitting: Neurology

## 2022-08-12 ENCOUNTER — Other Ambulatory Visit: Payer: Self-pay | Admitting: Neurology

## 2022-08-13 ENCOUNTER — Other Ambulatory Visit: Payer: Self-pay

## 2022-08-13 DIAGNOSIS — H4720 Unspecified optic atrophy: Secondary | ICD-10-CM

## 2022-08-13 DIAGNOSIS — H539 Unspecified visual disturbance: Secondary | ICD-10-CM

## 2022-08-14 ENCOUNTER — Other Ambulatory Visit: Payer: Self-pay | Admitting: Neurology

## 2022-08-14 ENCOUNTER — Encounter: Payer: Self-pay | Admitting: Neurology

## 2022-08-14 ENCOUNTER — Other Ambulatory Visit (HOSPITAL_COMMUNITY): Payer: Self-pay

## 2022-08-14 ENCOUNTER — Ambulatory Visit (HOSPITAL_COMMUNITY)
Admission: RE | Admit: 2022-08-14 | Discharge: 2022-08-14 | Disposition: A | Discharge status: Home / Self Care | Payer: 59 | Source: Ambulatory Visit | Attending: Vascular Surgery | Admitting: Vascular Surgery

## 2022-08-14 DIAGNOSIS — H4720 Unspecified optic atrophy: Secondary | ICD-10-CM | POA: Insufficient documentation

## 2022-08-14 DIAGNOSIS — H539 Unspecified visual disturbance: Secondary | ICD-10-CM | POA: Insufficient documentation

## 2022-08-14 MED ORDER — PREDNISONE 20 MG PO TABS
60.0000 mg | ORAL_TABLET | Freq: Every day | ORAL | 3 refills | Status: DC
  Filled 2022-08-14: qty 90, 30d supply, fill #0
  Filled 2022-09-05 – 2022-09-07 (×2): qty 90, 30d supply, fill #1
  Filled 2022-10-30: qty 90, 30d supply, fill #2

## 2022-08-15 NOTE — Telephone Encounter (Signed)
Pt read MD mychart message.

## 2022-08-19 ENCOUNTER — Encounter: Payer: Self-pay | Admitting: Adult Health

## 2022-08-20 DIAGNOSIS — H47293 Other optic atrophy, bilateral: Secondary | ICD-10-CM | POA: Diagnosis not present

## 2022-08-20 DIAGNOSIS — H524 Presbyopia: Secondary | ICD-10-CM | POA: Diagnosis not present

## 2022-08-28 ENCOUNTER — Other Ambulatory Visit (HOSPITAL_COMMUNITY): Payer: Self-pay

## 2022-08-28 DIAGNOSIS — F902 Attention-deficit hyperactivity disorder, combined type: Secondary | ICD-10-CM | POA: Diagnosis not present

## 2022-08-28 DIAGNOSIS — Z79899 Other long term (current) drug therapy: Secondary | ICD-10-CM | POA: Diagnosis not present

## 2022-08-28 MED ORDER — DEXTROAMPHETAMINE SULFATE ER 15 MG PO CP24
15.0000 mg | ORAL_CAPSULE | Freq: Two times a day (BID) | ORAL | 0 refills | Status: DC
  Filled 2022-08-28 – 2022-10-30 (×2): qty 60, 30d supply, fill #0

## 2022-08-28 MED ORDER — DEXTROAMPHETAMINE SULFATE ER 15 MG PO CP24
15.0000 mg | ORAL_CAPSULE | Freq: Two times a day (BID) | ORAL | 0 refills | Status: DC
  Filled 2022-08-28 – 2022-09-27 (×3): qty 60, 30d supply, fill #0

## 2022-08-30 DIAGNOSIS — G4733 Obstructive sleep apnea (adult) (pediatric): Secondary | ICD-10-CM | POA: Diagnosis not present

## 2022-09-05 ENCOUNTER — Other Ambulatory Visit (HOSPITAL_COMMUNITY): Payer: Self-pay

## 2022-09-06 ENCOUNTER — Other Ambulatory Visit (HOSPITAL_COMMUNITY): Payer: Self-pay

## 2022-09-07 ENCOUNTER — Other Ambulatory Visit (HOSPITAL_COMMUNITY): Payer: Self-pay

## 2022-09-09 ENCOUNTER — Other Ambulatory Visit (HOSPITAL_COMMUNITY): Payer: Self-pay

## 2022-09-18 ENCOUNTER — Other Ambulatory Visit (HOSPITAL_COMMUNITY): Payer: Self-pay

## 2022-09-24 ENCOUNTER — Encounter: Payer: Self-pay | Admitting: Neurology

## 2022-09-26 ENCOUNTER — Other Ambulatory Visit: Payer: Self-pay | Admitting: Neurology

## 2022-09-26 ENCOUNTER — Other Ambulatory Visit (HOSPITAL_COMMUNITY): Payer: Self-pay

## 2022-09-26 DIAGNOSIS — M316 Other giant cell arteritis: Secondary | ICD-10-CM

## 2022-09-26 MED ORDER — LIDOCAINE VISCOUS HCL 2 % MT SOLN
5.0000 mL | Freq: Four times a day (QID) | OROMUCOSAL | 1 refills | Status: AC
  Filled 2022-09-26: qty 360, 18d supply, fill #0

## 2022-09-27 ENCOUNTER — Other Ambulatory Visit (HOSPITAL_COMMUNITY): Payer: Self-pay

## 2022-09-29 DIAGNOSIS — G4733 Obstructive sleep apnea (adult) (pediatric): Secondary | ICD-10-CM | POA: Diagnosis not present

## 2022-10-02 DIAGNOSIS — I10 Essential (primary) hypertension: Secondary | ICD-10-CM | POA: Diagnosis not present

## 2022-10-02 DIAGNOSIS — F419 Anxiety disorder, unspecified: Secondary | ICD-10-CM | POA: Diagnosis not present

## 2022-10-02 DIAGNOSIS — M316 Other giant cell arteritis: Secondary | ICD-10-CM | POA: Diagnosis not present

## 2022-10-02 DIAGNOSIS — E039 Hypothyroidism, unspecified: Secondary | ICD-10-CM | POA: Diagnosis not present

## 2022-10-02 DIAGNOSIS — F909 Attention-deficit hyperactivity disorder, unspecified type: Secondary | ICD-10-CM | POA: Diagnosis not present

## 2022-10-02 DIAGNOSIS — E669 Obesity, unspecified: Secondary | ICD-10-CM | POA: Diagnosis not present

## 2022-10-02 DIAGNOSIS — E782 Mixed hyperlipidemia: Secondary | ICD-10-CM | POA: Diagnosis not present

## 2022-10-02 DIAGNOSIS — E1169 Type 2 diabetes mellitus with other specified complication: Secondary | ICD-10-CM | POA: Diagnosis not present

## 2022-10-03 ENCOUNTER — Encounter: Payer: Self-pay | Admitting: Adult Health

## 2022-10-03 ENCOUNTER — Other Ambulatory Visit (HOSPITAL_COMMUNITY): Payer: Self-pay

## 2022-10-03 MED ORDER — AMLODIPINE BESYLATE 5 MG PO TABS
5.0000 mg | ORAL_TABLET | Freq: Every day | ORAL | 1 refills | Status: DC
  Filled 2022-10-03: qty 90, 90d supply, fill #0
  Filled 2023-01-06: qty 90, 90d supply, fill #1

## 2022-10-03 MED ORDER — ATORVASTATIN CALCIUM 10 MG PO TABS
10.0000 mg | ORAL_TABLET | Freq: Every day | ORAL | 1 refills | Status: AC
  Filled 2022-10-03: qty 90, 90d supply, fill #0
  Filled 2023-01-13: qty 90, 90d supply, fill #1

## 2022-10-03 MED ORDER — SERTRALINE HCL 100 MG PO TABS
150.0000 mg | ORAL_TABLET | Freq: Every day | ORAL | 1 refills | Status: DC
  Filled 2022-10-03: qty 135, 90d supply, fill #0
  Filled 2023-01-13: qty 135, 90d supply, fill #1

## 2022-10-03 MED ORDER — METOPROLOL SUCCINATE ER 25 MG PO TB24
25.0000 mg | ORAL_TABLET | Freq: Every day | ORAL | 1 refills | Status: DC
  Filled 2022-10-03 – 2022-12-17 (×2): qty 90, 90d supply, fill #0
  Filled 2023-03-25: qty 90, 90d supply, fill #1

## 2022-10-07 ENCOUNTER — Other Ambulatory Visit (HOSPITAL_COMMUNITY): Payer: Self-pay

## 2022-10-08 ENCOUNTER — Other Ambulatory Visit (HOSPITAL_COMMUNITY): Payer: Self-pay

## 2022-10-08 MED ORDER — METFORMIN HCL ER 500 MG PO TB24
500.0000 mg | ORAL_TABLET | Freq: Every evening | ORAL | 1 refills | Status: DC
  Filled 2022-10-08: qty 30, 30d supply, fill #0
  Filled 2022-10-31: qty 30, 30d supply, fill #1

## 2022-10-22 ENCOUNTER — Other Ambulatory Visit (HOSPITAL_COMMUNITY): Payer: Self-pay

## 2022-10-23 ENCOUNTER — Other Ambulatory Visit (HOSPITAL_COMMUNITY): Payer: Self-pay

## 2022-10-23 DIAGNOSIS — E1165 Type 2 diabetes mellitus with hyperglycemia: Secondary | ICD-10-CM | POA: Diagnosis not present

## 2022-10-23 DIAGNOSIS — E039 Hypothyroidism, unspecified: Secondary | ICD-10-CM | POA: Diagnosis not present

## 2022-10-23 DIAGNOSIS — I10 Essential (primary) hypertension: Secondary | ICD-10-CM | POA: Diagnosis not present

## 2022-10-23 DIAGNOSIS — E785 Hyperlipidemia, unspecified: Secondary | ICD-10-CM | POA: Diagnosis not present

## 2022-10-23 MED ORDER — INSULIN PEN NEEDLE 32G X 4 MM MISC
1 refills | Status: AC
  Filled 2022-10-23: qty 100, 90d supply, fill #0

## 2022-10-23 MED ORDER — TOUJEO SOLOSTAR 300 UNIT/ML ~~LOC~~ SOPN
8.0000 [IU] | PEN_INJECTOR | Freq: Every day | SUBCUTANEOUS | 3 refills | Status: AC
  Filled 2022-10-23: qty 1.5, 56d supply, fill #0
  Filled 2022-12-21: qty 1.5, 56d supply, fill #1

## 2022-10-23 MED ORDER — FREESTYLE LITE TEST VI STRP
ORAL_STRIP | 3 refills | Status: AC
  Filled 2022-10-23: qty 100, 90d supply, fill #0

## 2022-10-24 ENCOUNTER — Other Ambulatory Visit (HOSPITAL_COMMUNITY)
Admission: RE | Admit: 2022-10-24 | Discharge: 2022-10-24 | Disposition: A | Discharge status: Home / Self Care | Payer: Commercial Managed Care - PPO | Source: Ambulatory Visit | Attending: Nurse Practitioner | Admitting: Nurse Practitioner

## 2022-10-24 ENCOUNTER — Encounter: Payer: Self-pay | Admitting: *Deleted

## 2022-10-24 DIAGNOSIS — E1165 Type 2 diabetes mellitus with hyperglycemia: Secondary | ICD-10-CM | POA: Insufficient documentation

## 2022-10-24 LAB — TSH: TSH: 0.58 u[IU]/mL (ref 0.350–4.500)

## 2022-10-24 LAB — LIPID PANEL
Cholesterol: 206 mg/dL — ABNORMAL HIGH (ref 0–200)
HDL: 51 mg/dL (ref >40)
LDL Cholesterol: 99 mg/dL (ref 0–99)
Total CHOL/HDL Ratio: 4 RATIO
Triglycerides: 282 mg/dL — ABNORMAL HIGH (ref <150)
VLDL: 56 mg/dL — ABNORMAL HIGH (ref 0–40)

## 2022-10-24 NOTE — Progress Notes (Incomplete)
PATIENT: Cheryl Hull DOB: [birth year]  REASON FOR VISIT: follow up HISTORY FROM: patient PRIMARY NEUROLOGIST:   Virtual Visit via Video Note  I connected with Cheryl Hull on 10/25/22 at 11:45 AM EST by a video enabled telemedicine application located remotely at [facility] and verified that I am speaking with the correct person using two identifiers who was located at their own home.   I discussed the limitations of evaluation and management by telemedicine and the availability of in person appointments. The patient expressed understanding and agreed to proceed.   PATIENT: Cheryl Hull DOB: April 08, 1976  REASON FOR VISIT: follow up HISTORY FROM: patient  HISTORY OF PRESENT ILLNESS: Today 10/25/22:  [name] is a 47 year old female with a history of   HISTORY   REVIEW OF SYSTEMS: Out of a complete 14 system review of symptoms, the patient complains only of the following symptoms, and all other reviewed systems are negative.  ALLERGIES: Allergies  Allergen Reactions   Lisinopril Swelling    HOME MEDICATIONS: Outpatient Medications Prior to Visit  Medication Sig Dispense Refill   amLODipine (NORVASC) 5 MG tablet Take 1 tablet (5 mg total) by mouth daily. 90 tablet 1   amLODipine (NORVASC) 5 MG tablet Take 1 tablet (5 mg total) by mouth daily. 90 tablet 1   atorvastatin (LIPITOR) 10 MG tablet Take 1 tablet by mouth once a day 90 tablet 1   atorvastatin (LIPITOR) 10 MG tablet Take 1 tablet (10 mg total) by mouth daily. 90 tablet 1   Blood Glucose Monitoring Suppl (FREESTYLE LITE) w/Device KIT Use to check blood sugar 1-2 times a day as directed 1 kit 0   Blood Glucose Monitoring Suppl (ONETOUCH VERIO IQ SYSTEM) w/Device KIT Check blood sugar 1 - 2 times daily as directed 1 kit 0   brimonidine (ALPHAGAN) 0.2 % ophthalmic solution Instill 1 drop into both eyes twice a day 15 mL 6   dextroamphetamine (DEXEDRINE SPANSULE) 15 MG 24 hr capsule Take 1  capsule (15 mg total) by mouth 2 (two) times daily. 60 capsule 0   dextroamphetamine (DEXEDRINE SPANSULE) 15 MG 24 hr capsule Take 1 capsule (15 mg total) by mouth 2 (two) times daily. (12/8) 60 capsule 0   glucose blood (FREESTYLE LITE) test strip Use as directed to test blood sugar once daily. 100 strip 3   glucose blood test strip Test 1 - 2 times daily as directed 100 strip 1   glucose blood test strip Use to check blood sugar 1-2 times a day as directed 100 each 0   insulin glargine, 1 Unit Dial, (TOUJEO SOLOSTAR) 300 UNIT/ML Solostar Pen Inject 8 Units into the skin daily. 3 mL 3   Insulin Pen Needle 32G X 4 MM MISC Use as directed once daily. 100 each 1   Lancets (FREESTYLE) lancets Use to check blood sugar 1-2 times a day as directed 100 each 0   levothyroxine (SYNTHROID) 200 MCG tablet Take 1 tablet in the morning on an empty stomach, except take 1 &1/2 tablets 1 day per week as directed 100 tablet 3   magic mouthwash (lidocaine, diphenhydrAMINE, alum & mag hydroxide) suspension Use 32ml to swish then spit out--use 4 times daily as directed 360 mL 1   metFORMIN (GLUCOPHAGE-XR) 500 MG 24 hr tablet Take 1 tablet (500 mg total) by mouth every evening with meal. 30 tablet 1   metoprolol succinate (TOPROL-XL) 25 MG 24 hr tablet Take 1 tablet (25 mg  total) by mouth daily. 90 tablet 1   predniSONE (DELTASONE) 20 MG tablet Take 3 tablets (60 mg total) by mouth daily. 90 tablet 3   sertraline (ZOLOFT) 100 MG tablet take 1 and 1/2 tablets by mouth once daily 135 tablet 0   sertraline (ZOLOFT) 100 MG tablet Take 1.5 tablets (150 mg total) by mouth daily. 135 tablet 1   No facility-administered medications prior to visit.    PAST MEDICAL HISTORY: Past Medical History:  Diagnosis Date   Hypertension    Thyroid disease     PAST SURGICAL HISTORY: No past surgical history on file.  FAMILY HISTORY: Family History  Problem Relation Age of Onset   Diabetes Mother    Diabetes Father    Diabetes  Sister    Glaucoma Maternal Grandmother    Diabetes Maternal Grandfather     SOCIAL HISTORY: Social History   Socioeconomic History   Marital status: Married    Spouse name: Event organiser   Number of children: 3   Years of education: Not on file   Highest education level: High school graduate  Occupational History   Not on file  Tobacco Use   Smoking status: Unknown   Smokeless tobacco: Not on file  Vaping Use   Vaping Use: Never used  Substance and Sexual Activity   Alcohol use: No   Drug use: No   Sexual activity: Not on file  Other Topics Concern   Not on file  Social History Narrative   Lives with husband   R handed   Caffeine:32 oz a day   Social Determinants of Health   Financial Resource Strain: Not on file  Food Insecurity: Not on file  Transportation Needs: Not on file  Physical Activity: Not on file  Stress: Not on file  Social Connections: Not on file  Intimate Partner Violence: Not on file      PHYSICAL EXAM Generalized: Well developed, in no acute distress   Neurological examination  Mentation: Alert oriented to time, place, history taking. Follows all commands speech and language fluent Cranial nerve II-XII:Extraocular movements were full. Facial symmetry noted. uvula tongue midline. Head turning and shoulder shrug  were normal and symmetric. Motor: Good strength throughout subjectively per patient Sensory: Sensory testing is intact to soft touch on all 4 extremities subjectively per patient Coordination: Cerebellar testing reveals good finger-nose-finger  Gait and station: Patient is able to stand from a seated position. gait is normal.  Reflexes: UTA  DIAGNOSTIC DATA (LABS, IMAGING, TESTING) - I reviewed patient records, labs, notes, testing and imaging myself where available.  No results found for: "WBC", "HGB", "HCT", "MCV", "PLT"    Component Value Date/Time   NA 142 08/07/2022 1001   K 4.3 08/07/2022 1001   CL 100 08/07/2022 1001   CO2 26  08/07/2022 1001   GLUCOSE 129 (H) 08/07/2022 1001   BUN 11 08/07/2022 1001   CREATININE 0.64 08/07/2022 1001   CALCIUM 9.8 08/07/2022 1001   PROT 7.8 08/07/2022 1001   ALBUMIN 4.4 08/07/2022 1001   AST 54 (H) 08/07/2022 1001   ALT 23 08/07/2022 1001   ALKPHOS 150 (H) 08/07/2022 1001   BILITOT 0.5 08/07/2022 1001   Lab Results  Component Value Date   CHOL 206 (H) 10/24/2022   HDL 51 10/24/2022   LDLCALC 99 10/24/2022   TRIG 282 (H) 10/24/2022   CHOLHDL 4.0 10/24/2022   No results found for: "HGBA1C" Lab Results  Component Value Date   VITAMINB12 164 (L) 05/28/2022  Lab Results  Component Value Date   TSH 0.580 10/24/2022      ASSESSMENT AND PLAN 47 y.o. year old female  has a past medical history of Hypertension and Thyroid disease. here with:  OSA on CPAP  CPAP compliance excellent Residual AHI is good Encouraged patient to continue using CPAP nightly and > 4 hours each night F/U in 1 year or sooner if needed  I spent 20 minutes of face-to-face and non-face-to-face time with patient.  This included previsit chart review, lab review, study review, order entry, electronic health record documentation, patient education.  Ward Givens, MSN, NP-C 10/25/2022, 11:38 AM [facility] [address] [place], [place] [address] [phone]

## 2022-10-24 NOTE — Telephone Encounter (Signed)
Pt called RN back. Please call back when available.

## 2022-10-25 ENCOUNTER — Encounter: Payer: 59 | Admitting: Adult Health

## 2022-10-25 LAB — INSULIN AND C-PEPTIDE, SERUM
C-Peptide: 6 ng/mL — ABNORMAL HIGH (ref 1.1–4.4)
Insulin: 24 u[IU]/mL (ref 2.6–24.9)

## 2022-10-25 LAB — GLUTAMIC ACID DECARBOXYLASE AUTO ABS: Glutamic Acid Decarb Ab: <5 U/mL (ref 0.0–5.0)

## 2022-10-28 NOTE — Progress Notes (Signed)
This encounter was created in error - please disregard.

## 2022-10-29 ENCOUNTER — Encounter: Payer: Self-pay | Admitting: Neurology

## 2022-10-30 DIAGNOSIS — G4733 Obstructive sleep apnea (adult) (pediatric): Secondary | ICD-10-CM | POA: Diagnosis not present

## 2022-10-31 ENCOUNTER — Other Ambulatory Visit (HOSPITAL_COMMUNITY): Payer: Self-pay

## 2022-10-31 MED ORDER — ONETOUCH VERIO VI STRP
ORAL_STRIP | 1 refills | Status: DC
  Filled 2022-10-31: qty 100, 50d supply, fill #0

## 2022-10-31 MED ORDER — METFORMIN HCL ER 500 MG PO TB24
ORAL_TABLET | ORAL | 5 refills | Status: DC
  Filled 2022-10-31 – 2022-11-01 (×2): qty 60, 30d supply, fill #0
  Filled 2022-12-10: qty 60, 30d supply, fill #1
  Filled 2023-02-10: qty 60, 30d supply, fill #2
  Filled 2023-04-14: qty 60, 30d supply, fill #3
  Filled 2023-06-22: qty 60, 30d supply, fill #4
  Filled 2023-09-28: qty 60, 30d supply, fill #5

## 2022-11-01 ENCOUNTER — Other Ambulatory Visit (HOSPITAL_COMMUNITY): Payer: Self-pay

## 2022-11-02 ENCOUNTER — Other Ambulatory Visit: Payer: Self-pay

## 2022-11-04 ENCOUNTER — Other Ambulatory Visit: Payer: Self-pay

## 2022-11-06 ENCOUNTER — Other Ambulatory Visit (HOSPITAL_COMMUNITY): Payer: Self-pay

## 2022-11-07 ENCOUNTER — Other Ambulatory Visit (HOSPITAL_COMMUNITY): Payer: Self-pay

## 2022-11-08 ENCOUNTER — Other Ambulatory Visit (HOSPITAL_COMMUNITY): Payer: Self-pay

## 2022-11-08 MED ORDER — FREESTYLE LANCETS MISC
3 refills | Status: AC
  Filled 2022-11-08: qty 100, 50d supply, fill #0

## 2022-11-12 DIAGNOSIS — E1165 Type 2 diabetes mellitus with hyperglycemia: Secondary | ICD-10-CM | POA: Diagnosis not present

## 2022-11-12 DIAGNOSIS — I10 Essential (primary) hypertension: Secondary | ICD-10-CM | POA: Diagnosis not present

## 2022-11-12 DIAGNOSIS — E039 Hypothyroidism, unspecified: Secondary | ICD-10-CM | POA: Diagnosis not present

## 2022-11-12 DIAGNOSIS — E785 Hyperlipidemia, unspecified: Secondary | ICD-10-CM | POA: Diagnosis not present

## 2022-11-19 ENCOUNTER — Other Ambulatory Visit (HOSPITAL_COMMUNITY): Payer: Self-pay

## 2022-11-19 MED ORDER — FREESTYLE LIBRE 3 SENSOR MISC
5 refills | Status: DC
  Filled 2022-11-19: qty 2, 30d supply, fill #0
  Filled 2022-12-20: qty 2, 30d supply, fill #1
  Filled 2023-01-13: qty 2, 30d supply, fill #2
  Filled 2023-02-12: qty 2, 30d supply, fill #3
  Filled 2023-04-02 – 2023-04-14 (×3): qty 2, 30d supply, fill #4
  Filled 2023-05-13: qty 2, 30d supply, fill #5

## 2022-11-21 ENCOUNTER — Other Ambulatory Visit (HOSPITAL_COMMUNITY): Payer: Self-pay

## 2022-11-28 DIAGNOSIS — G4733 Obstructive sleep apnea (adult) (pediatric): Secondary | ICD-10-CM | POA: Diagnosis not present

## 2022-11-30 DIAGNOSIS — G4733 Obstructive sleep apnea (adult) (pediatric): Secondary | ICD-10-CM | POA: Diagnosis not present

## 2022-12-05 ENCOUNTER — Encounter: Payer: Self-pay | Admitting: Neurology

## 2022-12-09 ENCOUNTER — Other Ambulatory Visit (HOSPITAL_COMMUNITY): Payer: Self-pay

## 2022-12-09 ENCOUNTER — Telehealth: Payer: Self-pay | Admitting: Neurology

## 2022-12-09 ENCOUNTER — Ambulatory Visit: Payer: Commercial Managed Care - PPO | Admitting: Neurology

## 2022-12-09 VITALS — BP 109/71 | HR 73 | Ht 64.0 in | Wt 175.2 lb

## 2022-12-09 DIAGNOSIS — H539 Unspecified visual disturbance: Secondary | ICD-10-CM

## 2022-12-09 DIAGNOSIS — H4720 Unspecified optic atrophy: Secondary | ICD-10-CM

## 2022-12-09 DIAGNOSIS — G4733 Obstructive sleep apnea (adult) (pediatric): Secondary | ICD-10-CM | POA: Diagnosis not present

## 2022-12-09 DIAGNOSIS — R700 Elevated erythrocyte sedimentation rate: Secondary | ICD-10-CM

## 2022-12-09 MED ORDER — PREDNISONE 5 MG PO TABS
ORAL_TABLET | ORAL | 3 refills | Status: AC
  Filled 2022-12-09: qty 90, 30d supply, fill #0
  Filled 2022-12-09: qty 90, fill #0

## 2022-12-09 NOTE — Telephone Encounter (Signed)
Called pharmacy and clarified medication for pt,medications is being filled.

## 2022-12-09 NOTE — Progress Notes (Signed)
[facility]  PATIENT: Cheryl Hull DOB: 1976-02-17  REFERRING DOCTOR OR PCP: Dr. Lucianne Lei (ophthalmology); Dr. Dema Severin (PCP) SOURCE: Patient, notes from Dr. Lucianne Lei, imaging and lab reports, MRI images personally reviewed  _________________________________   HISTORICAL  CHIEF COMPLAINT:  Chief Complaint  Patient presents with   Follow-up    RM 11, alone. Last seen 08/07/22. Here to discuss treatment options.    HISTORY OF PRESENT ILLNESS:  Cheryl Hull is a 47 y.o. 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  Update 12/09/2022: ESR and CRP were both elevated.    Since the last visit, she saw Vascular and had an ultrasound performed of the temporal arteries potentially worrisome for TA.  Therefore, we started prednisone.      She had side effects on a the  higher dose and we have titrated the prednisone down to 10 mg daily.  Sometimes her eyes feel scratchy.    Her blood sugars have been running higher.    . Vision is about the same.   She last saw ophthalmology (Dr. Lucianne Lei) before the prednisone was started.   Since the last visit she has had a home sleep study that showed moderate OSA (AHI 16.9) we have ordered AutoPap 5-16 cm with heated humidifier.   She has ha trouble with the mask and has a new one.       She reports vision seems the same now as last month's visit.    Depth perception is reduced and she tripped recently cutting leg.  She denies headache or jaw claudication.   No difficulty with gait or balance.  She has no numbness or weakness.  She does not need the bannister on stairs.   Bladder is fine.    She sleeps poorly at night.  Her husband tells her she snores.   She wakes up a lot of night and uses the bathroom (x 2).    She is sleepy during the day.  H/O visual changes: She noted the onset of progressive left visual decline late 2022.  Earlier that year, she had a normal ophthalmologic evaluation.   She went to [facility] around May  2023 and her optometrist noted left eye abnormality and reduced peripheral vision and requested a second opinion.   She went to Dr. Lucianne Lei with [facility].  VA was correctable to 20/20 either eye.    Trace NS of both lenses.     She was noted to have optic nerve pallor bilaterally and moderate attenuated vessels.    She had a workup for NAION and MRI's brain and orbit.    MRI showed left otic nerve atrophy.    Labs showed mildly elevated ESR and CRP.    Vascular Risks:   HTN .  Hyperlipidemia.     No cardiac issues, autoimmune or DM.    EPWORTH SLEEPINESS SCALE  On a scale of 0 - 3 what is the chance of dozing:  Sitting and Reading:   2 Watching TV:    2 Sitting inactive in a public place: 0 Passenger in car for one hour: 0 Lying down to rest in the afternoon: 3 Sitting and talking to someone: 0 Sitting quietly after lunch:  1 In a car, stopped in traffic:  0  Total (out of 24):     10/24   (mild EDS)   I personally reviewed the MRI of the brain and the orbits.  The MRI of the brain just shows a  couple small subcortical T2/FLAIR hyperintense foci.  None of these enhance.  They are most consistent with very minimal chronic microvascular ischemic change related to aging and hypertension.  The MRI of the orbits showed mild left optic nerve atrophy relative to the right.  There were no masses.  Blood work 05/17/2022 showed: mildly elevated ESR of 43.   CRP was also mildly elevated at 1.8.  B12 was low at 164 (180-914 nl).    Recent Results (from the past 2160 hour(s))  Glutamic acid decarboxylase auto abs     Status: None   Collection Time: 10/24/22  6:37 AM  Result Value Ref Range   Glutamic Acid Decarb Ab <5.0 0.0 - 5.0 U/mL    Comment: (NOTE) Performed At: [facility] [place], [place] [id number] Rush Farmer MD [id number]   Insulin and C-Peptide     Status: Abnormal   Collection Time: 10/24/22  6:37 AM  Result Value Ref Range   Insulin 24.0 2.6 -  24.9 uIU/mL   C-Peptide 6.0 (H) 1.1 - 4.4 ng/mL    Comment: (NOTE) C-Peptide reference interval is for fasting patients. Performed At: [facility] [place], [place] [id number] Rush Farmer MD [id number]   Lipid panel     Status: Abnormal   Collection Time: 10/24/22  6:37 AM  Result Value Ref Range   Cholesterol 206 (H) 0 - 200 mg/dL   Triglycerides 282 (H) <150 mg/dL   HDL 51 >40 mg/dL   Total CHOL/HDL Ratio 4.0 RATIO   VLDL 56 (H) 0 - 40 mg/dL   LDL Cholesterol 99 0 - 99 mg/dL    Comment:        Total Cholesterol/HDL:CHD Risk Coronary Heart Disease Risk Table                     Men   Women  1/2 Average Risk   3.4   3.3  Average Risk       5.0   4.4  2 X Average Risk   9.6   7.1  3 X Average Risk  23.4   11.0        Use the calculated Patient Ratio above and the CHD Risk Table to determine the patient's CHD Risk.        ATP III CLASSIFICATION (LDL):  <100     mg/dL   Optimal  100-129  mg/dL   Near or Above                    Optimal  130-159  mg/dL   Borderline  160-189  mg/dL   High  >190     mg/dL   Very High Performed at [facility] [address], [place], [place] [address]   TSH     Status: None   Collection Time: 10/24/22  6:37 AM  Result Value Ref Range   TSH 0.580 0.350 - 4.500 uIU/mL    Comment: Performed by a 3rd Generation assay with a functional sensitivity of <=0.01 uIU/mL. Performed at [facility], [place] [address] [place], [place] [address]      REVIEW OF SYSTEMS: Constitutional: No fevers, chills, sweats, or change in appetite Eyes: No visual changes, double vision, eye pain Ear, nose and throat: No hearing loss, ear pain, nasal congestion, sore throat Cardiovascular: No chest pain, palpitations Respiratory:  No shortness of breath at rest or with exertion.   No wheezes GastrointestinaI: No  nausea, vomiting, diarrhea, abdominal pain, fecal incontinence Genitourinary:  No  dysuria, urinary retention or frequency.  No nocturia. Musculoskeletal:  No neck pain, back pain Integumentary: No rash, pruritus, skin lesions Neurological: as above Psychiatric: No depression at this time.  No anxiety Endocrine: No palpitations, diaphoresis, change in appetite, change in weigh or increased thirst Hematologic/Lymphatic:  No anemia, purpura, petechiae. Allergic/Immunologic: No itchy/runny eyes, nasal congestion, recent allergic reactions, rashes  ALLERGIES: Allergies  Allergen Reactions   Lisinopril Swelling    HOME MEDICATIONS:  Current Outpatient Medications:    amLODipine (NORVASC) 5 MG tablet, Take 1 tablet (5 mg total) by mouth daily., Disp: 90 tablet, Rfl: 1   atorvastatin (LIPITOR) 10 MG tablet, Take 1 tablet (10 mg total) by mouth daily., Disp: 90 tablet, Rfl: 1   Blood Glucose Monitoring Suppl (FREESTYLE LITE) w/Device KIT, Use to check blood sugar 1-2 times a day as directed, Disp: 1 kit, Rfl: 0   brimonidine (ALPHAGAN) 0.2 % ophthalmic solution, Instill 1 drop into both eyes twice a day, Disp: 15 mL, Rfl: 6   Continuous Blood Gluc Sensor (FREESTYLE LIBRE 3 SENSOR) MISC, Use 1 sensor every 14 days as directed, Disp: 2 each, Rfl: 5   dextroamphetamine (DEXEDRINE SPANSULE) 15 MG 24 hr capsule, Take 1 capsule (15 mg total) by mouth 2 (two) times daily. (12/8), Disp: 60 capsule, Rfl: 0   glucose blood (FREESTYLE LITE) test strip, Use as directed to test blood sugar once daily., Disp: 100 strip, Rfl: 3   insulin glargine, 1 Unit Dial, (TOUJEO SOLOSTAR) 300 UNIT/ML Solostar Pen, Inject 8 Units into the skin daily., Disp: 3 mL, Rfl: 3   Insulin Pen Needle 32G X 4 MM MISC, Use as directed once daily., Disp: 100 each, Rfl: 1   Lancets (FREESTYLE) lancets, Use to check blood sugar 1-2 times a day as directed, Disp: 100 each, Rfl: 3   levothyroxine (SYNTHROID) 200 MCG tablet, Take 1 tablet in the morning on an empty stomach, except take 1 &1/2 tablets 1 day per week as  directed (Patient taking differently: Take 200 mcg by mouth daily before breakfast.), Disp: 100 tablet, Rfl: 3   magic mouthwash (lidocaine, diphenhydrAMINE, alum & mag hydroxide) suspension, Use 36m to swish then spit out--use 4 times daily as directed, Disp: 360 mL, Rfl: 1   metFORMIN (GLUCOPHAGE-XR) 500 MG 24 hr tablet, Take 2 tablets by mouth once daily with evening meal, Disp: 60 tablet, Rfl: 5   metoprolol succinate (TOPROL-XL) 25 MG 24 hr tablet, Take 1 tablet (25 mg total) by mouth daily., Disp: 90 tablet, Rfl: 1   predniSONE (DELTASONE) 5 MG tablet, Take as directed, Disp: 90 tablet, Rfl: 3   sertraline (ZOLOFT) 100 MG tablet, Take 1.5 tablets (150 mg total) by mouth daily., Disp: 135 tablet, Rfl: 1  PAST MEDICAL HISTORY: Past Medical History:  Diagnosis Date   Hypertension    Thyroid disease     PAST SURGICAL HISTORY: No past surgical history on file.  FAMILY HISTORY: Family History  Problem Relation Age of Onset   Diabetes Mother    Diabetes Father    Diabetes Sister    Glaucoma Maternal Grandmother    Diabetes Maternal Grandfather     SOCIAL HISTORY:  Social History   Socioeconomic History   Marital status: Married    Spouse name: SEvent organiser  Number of children: 3   Years of education: Not on file   Highest education level: High school graduate  Occupational History   Not  on file  Tobacco Use   Smoking status: Unknown   Smokeless tobacco: Not on file  Vaping Use   Vaping Use: Never used  Substance and Sexual Activity   Alcohol use: No   Drug use: No   Sexual activity: Not on file  Other Topics Concern   Not on file  Social History Narrative   Lives with husband   R handed   Caffeine:32 oz a day   Social Determinants of Health   Financial Resource Strain: Not on file  Food Insecurity: Not on file  Transportation Needs: Not on file  Physical Activity: Not on file  Stress: Not on file  Social Connections: Not on file  Intimate Partner Violence: Not  on file     PHYSICAL EXAM  Vitals:   12/09/22 1323  BP: 109/71  Pulse: 73  Weight: 175 lb 3.2 oz (79.5 kg)  Height: 5' 4"$  (1.626 m)    Body mass index is 30.07 kg/m.    General: The patient is well-developed and well-nourished and in no acute distress   Pharynx is Mallampati 2  HEENT:  Head is [place]/AT.  Sclera are anicteric.  Funduscopic examination showed optic nerve pallor, left worse than right.  Additionally, in both eyes vasculature seemed attenuated.   Can feel TA pulse.     Neck: No carotid bruits are noted.  The neck is nontender.  Neurologic Exam  Mental status: The patient is alert and oriented x 3 at the time of the examination. The patient has apparent normal recent and remote memory, with an apparently normal attention span and concentration ability.   Speech is normal.  Cranial nerves: Extraocular movements are full. Pupils are equal, round, and reactive to light and accomodation.  Color vision was symmetric.Marland Kitchen There is good facial sensation to soft touch bilaterally.Facial strength is normal.  Trapezius and sternocleidomastoid strength is normal. No dysarthria is noted.  The tongue is midline, and the patient has symmetric elevation of the soft palate. No obvious hearing deficits are noted.  Motor:  Muscle bulk is normal.   Tone is normal. Strength is  5 / 5 in all 4 extremities.   Sensory: Sensory testing is intact to pinprick, soft touch and vibration sensation in all 4 extremities.  Coordination: Cerebellar testing reveals good finger-nose-finger and heel-to-shin bilaterally.  Gait and station: Station is normal.   Gait is normal. Tandem gait is normal. Romberg is negative.   Reflexes: Deep tendon reflexes are symmetric and 3 at the knees and 2 elsewhere..         ASSESSMENT AND PLAN  Optic atrophy of both eyes  Visual disturbance - Plan: Sedimentation rate, C-reactive protein  Elevated sed rate - Plan: Sedimentation rate, C-reactive protein  OSA  (obstructive sleep apnea)  Etiology of optic atrophy uncertain.  Most likely nonarteritic.  However, since ESR and CRP were slightly elevated and temporal artery U/S was abnormal.   We did prednisone - no definite improvement.    We will taper prednisone off and recheck  ESR, CRP .  If these are increasing further, reconsider TA biopsy and a biologic agent.       Auto-PAP 5-16 cm we will check download and adjust if needed.   She is trying a new mask B12 supplements orally Rtc 3 months, sooner if problems     Rebacca Votaw A. Felecia Shelling, MD, [facility] [id number], [id number] PM Certified in Neurology, Clinical Neurophysiology, Sleep Medicine and Neuroimaging  [facility] [address], [address]  [place], [place] [address] [phone]  Orders Placed This Encounter  Procedures   Sedimentation rate   C-reactive protein

## 2022-12-09 NOTE — Telephone Encounter (Signed)
Pharmacy is asking for a call to clarify directions on predniSONE (DELTASONE) 5 MG tablet

## 2022-12-10 ENCOUNTER — Other Ambulatory Visit: Payer: Self-pay | Admitting: Neurology

## 2022-12-10 ENCOUNTER — Encounter: Payer: Self-pay | Admitting: Neurology

## 2022-12-10 DIAGNOSIS — H4720 Unspecified optic atrophy: Secondary | ICD-10-CM

## 2022-12-10 DIAGNOSIS — M316 Other giant cell arteritis: Secondary | ICD-10-CM

## 2022-12-10 LAB — C-REACTIVE PROTEIN: CRP: 11 mg/L — ABNORMAL HIGH (ref 0–10)

## 2022-12-10 LAB — SEDIMENTATION RATE: Sed Rate: 44 mm/hr — ABNORMAL HIGH (ref 0–32)

## 2022-12-11 ENCOUNTER — Other Ambulatory Visit: Payer: Self-pay | Admitting: Neurology

## 2022-12-11 DIAGNOSIS — M316 Other giant cell arteritis: Secondary | ICD-10-CM

## 2022-12-11 DIAGNOSIS — R700 Elevated erythrocyte sedimentation rate: Secondary | ICD-10-CM

## 2022-12-11 NOTE — Telephone Encounter (Signed)
Dr.Sater patient is replying to the below message. Please place lab orders.      The ESR and CRP were both mildly elevated but both improved compared to previous readings.   Because you were having trouble tolerating the steroid, continue the taper.  I would like to recheck the ESR and CRP in about 3 weeks to make sure that they are not rising again.  I see that you live in [place].  Would it be easier for you to do this repeat lab in our office or at a [facility]?  I can place the orders for either.   If you note any increase in symptoms, let me know.  I am also going to refer you to rheumatology to get their input."

## 2022-12-12 ENCOUNTER — Other Ambulatory Visit (HOSPITAL_COMMUNITY): Payer: Self-pay

## 2022-12-18 ENCOUNTER — Other Ambulatory Visit: Payer: Self-pay

## 2022-12-18 ENCOUNTER — Other Ambulatory Visit (HOSPITAL_COMMUNITY): Payer: Self-pay

## 2022-12-27 DIAGNOSIS — G4733 Obstructive sleep apnea (adult) (pediatric): Secondary | ICD-10-CM | POA: Diagnosis not present

## 2022-12-29 DIAGNOSIS — G4733 Obstructive sleep apnea (adult) (pediatric): Secondary | ICD-10-CM | POA: Diagnosis not present

## 2023-01-01 ENCOUNTER — Other Ambulatory Visit (INDEPENDENT_AMBULATORY_CARE_PROVIDER_SITE_OTHER): Payer: Self-pay

## 2023-01-01 DIAGNOSIS — R700 Elevated erythrocyte sedimentation rate: Secondary | ICD-10-CM | POA: Diagnosis not present

## 2023-01-01 DIAGNOSIS — M316 Other giant cell arteritis: Secondary | ICD-10-CM

## 2023-01-01 DIAGNOSIS — Z0289 Encounter for other administrative examinations: Secondary | ICD-10-CM

## 2023-01-02 LAB — C-REACTIVE PROTEIN: CRP: 7 mg/L (ref 0–10)

## 2023-01-02 LAB — SEDIMENTATION RATE: Sed Rate: 52 mm/hr — ABNORMAL HIGH (ref 0–32)

## 2023-01-06 ENCOUNTER — Other Ambulatory Visit (HOSPITAL_COMMUNITY): Payer: Self-pay

## 2023-01-07 ENCOUNTER — Other Ambulatory Visit: Payer: Self-pay

## 2023-01-08 ENCOUNTER — Other Ambulatory Visit (HOSPITAL_COMMUNITY): Payer: Self-pay

## 2023-01-08 MED ORDER — DEXTROAMPHETAMINE SULFATE ER 15 MG PO CP24
15.0000 mg | ORAL_CAPSULE | Freq: Two times a day (BID) | ORAL | 0 refills | Status: AC
  Filled 2023-03-27: qty 60, 30d supply, fill #0

## 2023-01-08 MED ORDER — DEXTROAMPHETAMINE SULFATE ER 15 MG PO CP24
15.0000 mg | ORAL_CAPSULE | Freq: Two times a day (BID) | ORAL | 0 refills | Status: AC
  Filled 2023-01-08: qty 60, 30d supply, fill #0

## 2023-01-15 ENCOUNTER — Other Ambulatory Visit (HOSPITAL_COMMUNITY): Payer: Self-pay

## 2023-01-16 ENCOUNTER — Other Ambulatory Visit (HOSPITAL_COMMUNITY): Payer: Self-pay

## 2023-01-20 NOTE — Progress Notes (Signed)
Office Visit Note  Patient: Cheryl Hull             Date of Birth: 1976/04/29           MRN: [id number]             PCP: Laurann Montana, MD Referring: Asa Lente, MD Visit Date: 01/21/2023 Occupation: Pharmacy tech  Subjective:  New Patient (Initial Visit) (Patient states she gets a tingling feeling in her hands. Patient states she has soreness in her thighs. Patient states her ankles start to feel sore and heavy. )   History of Present Illness: Cheryl Hull is a 47 y.o. female here for evaluation of possible temporal arteritis with elevated sedimentation rate and CRP with headache, left sided vision loss and abnormal temporal artery ultrasound.  Symptoms started last year with visual changes with left side visual field hemineglect.  She reports experiencing difficulties driving during dark or rain conditions.  Also describing new headaches in the frontal more midline distribution not localized over either temporal region.  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  She declined temporal artery biopsy due to questionable diagnosis and treatment implication.  Initial treatment with prednisone and normalization of CRP checked on March 13.  She is now off of steroids after evaluation with Dr. Sherol Dade and her normal inflammation test.  Was having unintentional weight gain and worsening of diabetes control side effect from the steroids.  She is still getting some of the headaches but also complains of soreness in both eyes and aches and feels heaviness with lifting her legs and and ankles.  Intermittently experiences tingling sensation in her hands not associated with any significant task difficulty or unintentional dropping items.   08/14/22 Temporal artery vascular ultrasound ++-------------------+------------------+  Right Halo POSITIVELeft Halo POSITIVE  ++-------------------+------------------+  +--------+----------+-----------+----------+-----------+          Width (cm)Lenght (cm)Width (cm)Lenght (cm)  +--------+----------+-----------+----------+-----------+  Proximal0.06                0.04                   +--------+----------+-----------+----------+-----------+  Summary:  Presence of a "halo" sign in the bilateral temporal artery suggests temporal arteritis.     05/2022 MRI Brain and Orbits IMPRESSION: MRI brain: 1. Mildly motion degraded exam. 2. Several small scattered foci of T2 FLAIR hyperintense signal abnormality within the bilateral cerebral white matter (measuring up to 3 mm). These signal changes are nonspecific and differential considerations include chronic small vessel ischemic disease, sequela of chronic migraine headaches, sequela of a prior infectious/inflammatory process or sequela of a demyelinating process (such as multiple sclerosis), among others. 3. Otherwise unremarkable MRI appearance of the brain. MRI orbits: 1. Atrophic left optic nerve. Although nonspecific, this could reflect sequela of prior optic neuritis. 2. Otherwise unremarkable MRI appearance of the orbits. 3. Minimal mucosal thickening within the bilateral ethmoid sinuses.  Activities of Daily Living:  Patient reports morning stiffness for 5-10 minutes.   Patient Denies nocturnal pain.  Difficulty dressing/grooming: Denies Difficulty climbing stairs: Denies Difficulty getting out of chair: Denies Difficulty using hands for taps, buttons, cutlery, and/or writing: Denies  Review of Systems  Constitutional:  Positive for fatigue.  HENT:  Positive for mouth dryness. Negative for mouth sores.   Eyes:  Negative for dryness.  Respiratory:  Negative for shortness of breath.   Cardiovascular:  Negative for chest pain and palpitations.  Gastrointestinal:  Negative for blood in stool, constipation and  diarrhea.  Endocrine: Negative for increased urination.  Genitourinary:  Negative for involuntary urination.  Musculoskeletal:  Positive for joint  pain, gait problem, joint pain, myalgias, muscle weakness, morning stiffness, muscle tenderness and myalgias. Negative for joint swelling.  Skin:  Positive for sensitivity to sunlight. Negative for color change, rash and hair loss.  Allergic/Immunologic: Negative for susceptible to infections.  Neurological:  Positive for headaches. Negative for dizziness.  Hematological:  Negative for swollen glands.  Psychiatric/Behavioral:  Positive for sleep disturbance. Negative for depressed mood. The patient is nervous/anxious.     PMFS History:  Patient Active Problem List   Diagnosis Date Noted   Optic atrophy of both eyes 06/12/2022   Visual disturbance 06/12/2022   Elevated sed rate 06/12/2022   Snoring 06/12/2022   Excessive daytime sleepiness 06/12/2022   HEMORRHOIDS 11/07/2008   NAUSEA AND VOMITING 09/27/2008   UNSPECIFIED HYPOTHYROIDISM 09/21/2008   RECTAL BLEEDING 09/21/2008   FATTY LIVER DISEASE 09/21/2008    Past Medical History:  Diagnosis Date   Diabetes type 2, controlled    Hyperlipidemia    Hypertension    Thyroid disease     Family History  Problem Relation Age of Onset   Diabetes Mother    Diabetes Father    Diabetes Sister    Glaucoma Maternal Grandmother    Diabetes Maternal Grandfather    History reviewed. No pertinent surgical history. Social History   Social History Narrative   Lives with husband   R handed   Caffeine:32 oz a day    There is no immunization history on file for this patient.   Objective: Vital Signs: BP 125/72 (BP Location: Right Arm, Patient Position: Sitting, Cuff Size: Normal)   Pulse 83   Resp 16   Ht 5' 3.75" (1.619 m)   Wt 180 lb (81.6 kg)   LMP 01/21/2023   BMI 31.14 kg/m    Physical Exam Constitutional:      Appearance: She is obese.  HENT:     Mouth/Throat:     Mouth: Mucous membranes are moist.     Pharynx: Oropharynx is clear.  Eyes:     Conjunctiva/sclera: Conjunctivae normal.  Cardiovascular:     Rate and  Rhythm: Normal rate and regular rhythm.  Pulmonary:     Effort: Pulmonary effort is normal.     Breath sounds: Normal breath sounds.  Musculoskeletal:     Right lower leg: No edema.     Left lower leg: No edema.  Skin:    Findings: Rash present.     Comments: Central facial rash erythema with papules  Neurological:     Mental Status: She is alert.     Comments: Very strong knee and ankle jerk reflexes Ankle clonus with passive dorsiflexion  Psychiatric:        Mood and Affect: Mood normal.      Musculoskeletal Exam:  Shoulders full ROM no tenderness or swelling Elbows full ROM no tenderness or swelling Wrists full ROM no tenderness or swelling Fingers full ROM no tenderness or swelling Hip normal internal and external rotation without pain, no tenderness to lateral hip palpation Knees full ROM no tenderness or swelling Ankles full ROM no tenderness or swelling   Investigation: No additional findings.  Imaging: No results found.  Recent Labs: Lab Results  Component Value Date   NA 142 08/07/2022   K 4.3 08/07/2022   CL 100 08/07/2022   CO2 26 08/07/2022   GLUCOSE 129 (H) 08/07/2022   BUN 11  08/07/2022   CREATININE 0.64 08/07/2022   BILITOT 0.5 08/07/2022   ALKPHOS 150 (H) 08/07/2022   AST 54 (H) 08/07/2022   ALT 23 08/07/2022   PROT 7.8 08/07/2022   ALBUMIN 4.4 08/07/2022   CALCIUM 9.8 08/07/2022    Speciality Comments: No specialty comments available.  Procedures:  No procedures performed Allergies: Lisinopril   Assessment / Plan:     Visit Diagnoses: Optic atrophy of both eyes  Optic atrophy without clearly identified cause though with elevated serum inflammatory markers and some systemic body aches concerning for immune mediated process.  Artery ultrasound was suggestive but overall presentation unusual and patient demographic very unusual for temporal arteritis.  If initial testing very nonspecific could benefit from MR angiography off of steroids as  an additional noninvasive test option.  Elevated sed rate - Plan: CK, Sedimentation rate, C-reactive protein, Protein Electrophoresis, (serum), Anti-DNA antibody, double-stranded, C3 and C4  Rechecking labs now off of steroids since last month including sed rate CRP will also check double-stranded DNA complements and SPEP.  Will check CK with the lower extremity myalgias and heaviness or weakness sensation complaint.   Myalgia  Checking CK as above.  Although objectively no deficit on exam today and symptoms not present throughout both hip and shoulder girdle.  Orders: Orders Placed This Encounter  Procedures   CK   Sedimentation rate   C-reactive protein   Protein Electrophoresis, (serum)   Anti-DNA antibody, double-stranded   C3 and C4   No orders of the defined types were placed in this encounter.    Follow-Up Instructions: No follow-ups on file.   Fuller Plan, MD  Note - This record has been created using [employer].  Chart creation errors have been sought, but may not always  have been located. Such creation errors do not reflect on  the standard of medical care.

## 2023-01-21 ENCOUNTER — Ambulatory Visit: Payer: Commercial Managed Care - PPO | Attending: Internal Medicine | Admitting: Internal Medicine

## 2023-01-21 ENCOUNTER — Encounter: Payer: Self-pay | Admitting: Internal Medicine

## 2023-01-21 VITALS — BP 125/72 | HR 83 | Resp 16 | Ht 63.75 in | Wt 180.0 lb

## 2023-01-21 DIAGNOSIS — R700 Elevated erythrocyte sedimentation rate: Secondary | ICD-10-CM

## 2023-01-21 DIAGNOSIS — H4720 Unspecified optic atrophy: Secondary | ICD-10-CM | POA: Diagnosis not present

## 2023-01-21 DIAGNOSIS — M791 Myalgia, unspecified site: Secondary | ICD-10-CM | POA: Diagnosis not present

## 2023-01-22 ENCOUNTER — Other Ambulatory Visit: Payer: Self-pay

## 2023-01-24 LAB — PROTEIN ELECTROPHORESIS, SERUM
Albumin ELP: 3.9 g/dL (ref 3.8–4.8)
Alpha 1: 0.2 g/dL (ref 0.2–0.3)
Alpha 2: 0.8 g/dL (ref 0.5–0.9)
Beta 2: 0.4 g/dL (ref 0.2–0.5)
Beta Globulin: 0.5 g/dL (ref 0.4–0.6)
Gamma Globulin: 1.2 g/dL (ref 0.8–1.7)
Total Protein: 7.1 g/dL (ref 6.1–8.1)

## 2023-01-24 LAB — ANTI-DNA ANTIBODY, DOUBLE-STRANDED: ds DNA Ab: <1 IU/mL

## 2023-01-24 LAB — CK: Total CK: 102 U/L (ref 29–143)

## 2023-01-24 LAB — C3 AND C4
C3 Complement: 172 mg/dL (ref 83–193)
C4 Complement: 21 mg/dL (ref 15–57)

## 2023-01-24 LAB — C-REACTIVE PROTEIN: CRP: 15.2 mg/L — ABNORMAL HIGH (ref <8.0)

## 2023-01-24 LAB — SEDIMENTATION RATE: Sed Rate: 50 mm/h — ABNORMAL HIGH (ref 0–20)

## 2023-01-27 DIAGNOSIS — G4733 Obstructive sleep apnea (adult) (pediatric): Secondary | ICD-10-CM | POA: Diagnosis not present

## 2023-01-29 DIAGNOSIS — G4733 Obstructive sleep apnea (adult) (pediatric): Secondary | ICD-10-CM | POA: Diagnosis not present

## 2023-01-30 ENCOUNTER — Other Ambulatory Visit (HOSPITAL_COMMUNITY): Payer: Self-pay

## 2023-01-30 ENCOUNTER — Encounter: Payer: Self-pay | Admitting: Internal Medicine

## 2023-01-30 MED ORDER — TRIAZOLAM 0.25 MG PO TABS
0.5000 mg | ORAL_TABLET | ORAL | 0 refills | Status: AC
  Filled 2023-01-30: qty 2, 1d supply, fill #0

## 2023-01-31 NOTE — Progress Notes (Signed)
Returning patient call went to VM x1  Lab results are negative for any specific antibody markers but her sedimentation rate and CRP have increased again after they went down to normal in March after prednisone taper. This is concerning about ongoing inflammation.  I think we will need to check some additional vascular imaging ideally MR angiogram of the head and neck. This would be noninvasive but get more information about blood vessel inflammation. If MRA is not possible in a short time frame CTA would be okay.  If she notices any increase in symptoms may also need to resume some treatment for inflammation such as prednisone but would restart at lower dose 10 mg daily but with slower tapering.

## 2023-02-11 ENCOUNTER — Other Ambulatory Visit: Payer: Self-pay | Admitting: Internal Medicine

## 2023-02-11 ENCOUNTER — Ambulatory Visit (HOSPITAL_COMMUNITY)
Admission: RE | Admit: 2023-02-11 | Discharge: 2023-02-11 | Disposition: A | Discharge status: Home / Self Care | Payer: Commercial Managed Care - PPO | Source: Ambulatory Visit | Attending: Internal Medicine | Admitting: Internal Medicine

## 2023-02-11 ENCOUNTER — Other Ambulatory Visit: Payer: Self-pay | Admitting: *Deleted

## 2023-02-11 DIAGNOSIS — H4720 Unspecified optic atrophy: Secondary | ICD-10-CM

## 2023-02-11 DIAGNOSIS — R700 Elevated erythrocyte sedimentation rate: Secondary | ICD-10-CM

## 2023-02-11 DIAGNOSIS — M791 Myalgia, unspecified site: Secondary | ICD-10-CM

## 2023-02-11 DIAGNOSIS — Z0389 Encounter for observation for other suspected diseases and conditions ruled out: Secondary | ICD-10-CM | POA: Diagnosis not present

## 2023-02-11 MED ORDER — GADOBUTROL 1 MMOL/ML IV SOLN
7.0000 mL | Freq: Once | INTRAVENOUS | Status: AC | PRN
  Administered 2023-02-11: 7 mL via INTRAVENOUS

## 2023-02-12 ENCOUNTER — Ambulatory Visit (HOSPITAL_COMMUNITY): Admission: RE | Admit: 2023-02-12 | Payer: Commercial Managed Care - PPO | Source: Ambulatory Visit

## 2023-02-17 DIAGNOSIS — E039 Hypothyroidism, unspecified: Secondary | ICD-10-CM | POA: Diagnosis not present

## 2023-02-17 DIAGNOSIS — I10 Essential (primary) hypertension: Secondary | ICD-10-CM | POA: Diagnosis not present

## 2023-02-17 DIAGNOSIS — E785 Hyperlipidemia, unspecified: Secondary | ICD-10-CM | POA: Diagnosis not present

## 2023-02-17 DIAGNOSIS — E1165 Type 2 diabetes mellitus with hyperglycemia: Secondary | ICD-10-CM | POA: Diagnosis not present

## 2023-02-20 ENCOUNTER — Telehealth: Payer: Self-pay | Admitting: *Deleted

## 2023-02-20 NOTE — Telephone Encounter (Signed)
Patient contacted the office and would like her MRA results. Please advise.

## 2023-02-20 NOTE — Telephone Encounter (Signed)
Advised patient per Dr. Dimple Casey The MRA result looks good she has no evidence of vasculitis in the head or neck seen. There are also no major blockages or narrowing in her head and neck arteries. Based on this Dr. Dimple Casey thinks giant cell arteritis is probably not the cause for her increased serum inflammatory labs. Is she having any increase in visual symptoms, headaches, or jaw pain? Patient states she has not has an increase visual symptoms, headaches, or jaw pain. If so we can take another look anyways. Patient verbalized understanding.    Patient states she is just having some muscle pain and soreness in her legs.

## 2023-02-20 NOTE — Telephone Encounter (Signed)
The MRA result looks good she has no evidence of vasculitis in the head or neck seen. There are also no major blockages or narrowing in her head and neck arteries. Based on this I think giant cell arteritis is probably not the cause for her increased serum inflammatory labs. Is she having any increase in visual symptoms, headaches, or jaw pain? If so we can take another look anyways.

## 2023-02-20 NOTE — Telephone Encounter (Signed)
Attempted to contact the patient and left a message to call the office back. 

## 2023-02-25 ENCOUNTER — Other Ambulatory Visit (HOSPITAL_COMMUNITY): Payer: Self-pay

## 2023-02-25 MED ORDER — JARDIANCE 10 MG PO TABS
10.0000 mg | ORAL_TABLET | Freq: Every day | ORAL | 3 refills | Status: AC
  Filled 2023-02-25 – 2023-05-02 (×3): qty 30, 30d supply, fill #0
  Filled 2023-05-28: qty 30, 30d supply, fill #1
  Filled 2023-06-30: qty 30, 30d supply, fill #2
  Filled 2023-12-09: qty 30, 30d supply, fill #3

## 2023-02-26 DIAGNOSIS — Z79899 Other long term (current) drug therapy: Secondary | ICD-10-CM | POA: Diagnosis not present

## 2023-02-26 DIAGNOSIS — F902 Attention-deficit hyperactivity disorder, combined type: Secondary | ICD-10-CM | POA: Diagnosis not present

## 2023-02-28 DIAGNOSIS — G4733 Obstructive sleep apnea (adult) (pediatric): Secondary | ICD-10-CM | POA: Diagnosis not present

## 2023-03-04 LAB — HM DIABETES EYE EXAM

## 2023-03-05 ENCOUNTER — Other Ambulatory Visit (HOSPITAL_COMMUNITY): Payer: Self-pay

## 2023-03-06 ENCOUNTER — Other Ambulatory Visit (HOSPITAL_COMMUNITY): Payer: Self-pay

## 2023-03-07 ENCOUNTER — Other Ambulatory Visit (HOSPITAL_COMMUNITY): Payer: Self-pay

## 2023-03-07 MED ORDER — DEXTROAMPHETAMINE SULFATE ER 15 MG PO CP24
15.0000 mg | ORAL_CAPSULE | Freq: Two times a day (BID) | ORAL | 0 refills | Status: AC
  Filled 2023-06-30: qty 60, 30d supply, fill #0

## 2023-03-07 MED ORDER — DEXTROAMPHETAMINE SULFATE ER 15 MG PO CP24
15.0000 mg | ORAL_CAPSULE | Freq: Two times a day (BID) | ORAL | 0 refills | Status: DC
  Filled 2023-09-02: qty 60, 30d supply, fill #0

## 2023-03-07 MED ORDER — DEXTROAMPHETAMINE SULFATE ER 15 MG PO CP24
15.0000 mg | ORAL_CAPSULE | Freq: Two times a day (BID) | ORAL | 0 refills | Status: AC
  Filled 2023-03-07 – 2023-05-25 (×2): qty 60, 30d supply, fill #0

## 2023-03-09 ENCOUNTER — Encounter: Payer: Self-pay | Admitting: Neurology

## 2023-03-14 ENCOUNTER — Other Ambulatory Visit (HOSPITAL_COMMUNITY): Payer: Self-pay

## 2023-03-21 ENCOUNTER — Other Ambulatory Visit (HOSPITAL_COMMUNITY): Payer: Self-pay

## 2023-03-25 ENCOUNTER — Encounter: Payer: Commercial Managed Care - PPO | Admitting: Internal Medicine

## 2023-03-27 ENCOUNTER — Other Ambulatory Visit (HOSPITAL_COMMUNITY): Payer: Self-pay

## 2023-03-31 DIAGNOSIS — G4733 Obstructive sleep apnea (adult) (pediatric): Secondary | ICD-10-CM | POA: Diagnosis not present

## 2023-04-02 ENCOUNTER — Other Ambulatory Visit (HOSPITAL_COMMUNITY): Payer: Self-pay

## 2023-04-02 MED ORDER — LEVOTHYROXINE SODIUM 200 MCG PO TABS
200.0000 ug | ORAL_TABLET | Freq: Every morning | ORAL | 1 refills | Status: AC
  Filled 2023-04-02 – 2023-04-14 (×2): qty 90, 90d supply, fill #0

## 2023-04-03 DIAGNOSIS — M25571 Pain in right ankle and joints of right foot: Secondary | ICD-10-CM | POA: Diagnosis not present

## 2023-04-03 DIAGNOSIS — M25551 Pain in right hip: Secondary | ICD-10-CM | POA: Diagnosis not present

## 2023-04-03 DIAGNOSIS — M25572 Pain in left ankle and joints of left foot: Secondary | ICD-10-CM | POA: Diagnosis not present

## 2023-04-03 DIAGNOSIS — I10 Essential (primary) hypertension: Secondary | ICD-10-CM | POA: Diagnosis not present

## 2023-04-03 DIAGNOSIS — E669 Obesity, unspecified: Secondary | ICD-10-CM | POA: Diagnosis not present

## 2023-04-03 DIAGNOSIS — F419 Anxiety disorder, unspecified: Secondary | ICD-10-CM | POA: Diagnosis not present

## 2023-04-03 DIAGNOSIS — E039 Hypothyroidism, unspecified: Secondary | ICD-10-CM | POA: Diagnosis not present

## 2023-04-03 DIAGNOSIS — E1169 Type 2 diabetes mellitus with other specified complication: Secondary | ICD-10-CM | POA: Diagnosis not present

## 2023-04-03 DIAGNOSIS — E782 Mixed hyperlipidemia: Secondary | ICD-10-CM | POA: Diagnosis not present

## 2023-04-03 DIAGNOSIS — M25552 Pain in left hip: Secondary | ICD-10-CM | POA: Diagnosis not present

## 2023-04-10 ENCOUNTER — Other Ambulatory Visit (HOSPITAL_COMMUNITY): Payer: Self-pay

## 2023-04-14 ENCOUNTER — Other Ambulatory Visit (HOSPITAL_COMMUNITY): Payer: Self-pay

## 2023-04-15 ENCOUNTER — Other Ambulatory Visit: Payer: Self-pay

## 2023-04-15 ENCOUNTER — Other Ambulatory Visit (HOSPITAL_COMMUNITY): Payer: Self-pay

## 2023-04-16 ENCOUNTER — Other Ambulatory Visit (HOSPITAL_COMMUNITY): Payer: Self-pay

## 2023-04-16 MED ORDER — AMLODIPINE BESYLATE 5 MG PO TABS
5.0000 mg | ORAL_TABLET | Freq: Every day | ORAL | 1 refills | Status: DC
  Filled 2023-04-16: qty 90, 90d supply, fill #0
  Filled 2023-07-20: qty 90, 90d supply, fill #1

## 2023-04-16 MED ORDER — SERTRALINE HCL 100 MG PO TABS
100.0000 mg | ORAL_TABLET | Freq: Every day | ORAL | 1 refills | Status: DC
  Filled 2023-04-16: qty 135, 90d supply, fill #0
  Filled 2023-07-20: qty 135, 90d supply, fill #1

## 2023-04-22 ENCOUNTER — Other Ambulatory Visit (HOSPITAL_COMMUNITY): Payer: Self-pay

## 2023-04-29 ENCOUNTER — Ambulatory Visit: Payer: Commercial Managed Care - PPO | Admitting: Neurology

## 2023-05-02 ENCOUNTER — Other Ambulatory Visit (HOSPITAL_COMMUNITY): Payer: Self-pay

## 2023-05-15 ENCOUNTER — Other Ambulatory Visit (HOSPITAL_COMMUNITY): Payer: Self-pay

## 2023-05-18 NOTE — Progress Notes (Unsigned)
Office Visit Note  Patient: Cheryl Hull             Date of Birth: May 07, 1976           MRN: [id number]             PCP: Laurann Montana, MD Referring: Laurann Montana, MD Visit Date: 05/20/2023   Subjective:  No chief complaint on file.   History of Present Illness: Cheryl Hull is a 47 y.o. female here for follow up ***   Previous HPI 01/21/23 Cheryl Hull is a 47 y.o. female here for evaluation of possible temporal arteritis with elevated sedimentation rate and CRP with headache, left sided vision loss and abnormal temporal artery ultrasound.  Symptoms started last year with visual changes with left side visual field hemineglect.  She reports experiencing difficulties driving during dark or rain conditions.  Also describing new headaches in the frontal more midline distribution not localized over either temporal region.  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  She declined temporal artery biopsy due to questionable diagnosis and treatment implication.  Initial treatment with prednisone and normalization of CRP checked on March 13.  She is now off of steroids after evaluation with Dr. Sherol Dade and her normal inflammation test.  Was having unintentional weight gain and worsening of diabetes control side effect from the steroids.  She is still getting some of the headaches but also complains of soreness in both eyes and aches and feels heaviness with lifting her legs and and ankles.  Intermittently experiences tingling sensation in her hands not associated with any significant task difficulty or unintentional dropping items.     08/14/22 Temporal artery vascular ultrasound ++-------------------+------------------+  Right Halo POSITIVELeft Halo POSITIVE  ++-------------------+------------------+  +--------+----------+-----------+----------+-----------+         Width (cm)Lenght (cm)Width (cm)Lenght (cm)  +--------+----------+-----------+----------+-----------+   Proximal0.06                0.04                   +--------+----------+-----------+----------+-----------+  Summary:  Presence of a "halo" sign in the bilateral temporal artery suggests temporal arteritis.        05/2022 MRI Brain and Orbits IMPRESSION: MRI brain: 1. Mildly motion degraded exam. 2. Several small scattered foci of T2 FLAIR hyperintense signal abnormality within the bilateral cerebral white matter (measuring up to 3 mm). These signal changes are nonspecific and differential considerations include chronic small vessel ischemic disease, sequela of chronic migraine headaches, sequela of a prior infectious/inflammatory process or sequela of a demyelinating process (such as multiple sclerosis), among others. 3. Otherwise unremarkable MRI appearance of the brain. MRI orbits: 1. Atrophic left optic nerve. Although nonspecific, this could reflect sequela of prior optic neuritis. 2. Otherwise unremarkable MRI appearance of the orbits. 3. Minimal mucosal thickening within the bilateral ethmoid sinuses.   No Rheumatology ROS completed.   PMFS History:  Patient Active Problem List   Diagnosis Date Noted   Optic atrophy of both eyes 06/12/2022   Visual disturbance 06/12/2022   Elevated sed rate 06/12/2022   Snoring 06/12/2022   Excessive daytime sleepiness 06/12/2022   HEMORRHOIDS 11/07/2008   NAUSEA AND VOMITING 09/27/2008   UNSPECIFIED HYPOTHYROIDISM 09/21/2008   RECTAL BLEEDING 09/21/2008   FATTY LIVER DISEASE 09/21/2008    Past Medical History:  Diagnosis Date   Diabetes type 2, controlled (HCC)    Hyperlipidemia    Hypertension    Thyroid disease  Family History  Problem Relation Age of Onset   Diabetes Mother    Diabetes Father    Diabetes Sister    Glaucoma Maternal Grandmother    Diabetes Maternal Grandfather    No past surgical history on file. Social History   Social History Narrative   Lives with husband   R handed   Caffeine:32 oz a day     There is no immunization history on file for this patient.   Objective: Vital Signs: There were no vitals taken for this visit.   Physical Exam   Musculoskeletal Exam: ***  CDAI Exam: CDAI Score: -- Patient Global: --; Provider Global: -- Swollen: --; Tender: -- Joint Exam 05/20/2023   No joint exam has been documented for this visit   There is currently no information documented on the homunculus. Go to the Rheumatology activity and complete the homunculus joint exam.  Investigation: No additional findings.  Imaging: No results found.  Recent Labs: Lab Results  Component Value Date   NA 142 08/07/2022   K 4.3 08/07/2022   CL 100 08/07/2022   CO2 26 08/07/2022   GLUCOSE 129 (H) 08/07/2022   BUN 11 08/07/2022   CREATININE 0.64 08/07/2022   BILITOT 0.5 08/07/2022   ALKPHOS 150 (H) 08/07/2022   AST 54 (H) 08/07/2022   ALT 23 08/07/2022   PROT 7.1 01/21/2023   ALBUMIN 4.4 08/07/2022   CALCIUM 9.8 08/07/2022    Speciality Comments: No specialty comments available.  Procedures:  No procedures performed Allergies: Lisinopril   Assessment / Plan:     Visit Diagnoses: No diagnosis found.  ***  Orders: No orders of the defined types were placed in this encounter.  No orders of the defined types were placed in this encounter.    Follow-Up Instructions: No follow-ups on file.   Fuller Plan, MD  Note - This record has been created using [employer].  Chart creation errors have been sought, but may not always  have been located. Such creation errors do not reflect on  the standard of medical care.

## 2023-05-20 ENCOUNTER — Encounter: Payer: Self-pay | Admitting: Internal Medicine

## 2023-05-20 ENCOUNTER — Ambulatory Visit: Payer: Commercial Managed Care - PPO | Attending: Internal Medicine | Admitting: Internal Medicine

## 2023-05-20 VITALS — BP 112/72 | HR 86 | Resp 14 | Ht 63.5 in | Wt 169.0 lb

## 2023-05-20 DIAGNOSIS — R700 Elevated erythrocyte sedimentation rate: Secondary | ICD-10-CM | POA: Diagnosis not present

## 2023-05-20 DIAGNOSIS — M159 Polyosteoarthritis, unspecified: Secondary | ICD-10-CM

## 2023-05-20 DIAGNOSIS — R531 Weakness: Secondary | ICD-10-CM | POA: Diagnosis not present

## 2023-05-20 DIAGNOSIS — H4720 Unspecified optic atrophy: Secondary | ICD-10-CM | POA: Diagnosis not present

## 2023-05-26 ENCOUNTER — Other Ambulatory Visit: Payer: Self-pay

## 2023-05-26 ENCOUNTER — Other Ambulatory Visit (HOSPITAL_COMMUNITY): Payer: Self-pay

## 2023-06-11 ENCOUNTER — Other Ambulatory Visit (HOSPITAL_COMMUNITY): Payer: Self-pay

## 2023-06-11 ENCOUNTER — Telehealth: Payer: Self-pay

## 2023-06-11 MED ORDER — FREESTYLE LIBRE 3 SENSOR MISC
5 refills | Status: DC
  Filled 2023-06-11: qty 2, 28d supply, fill #0
  Filled 2023-07-04: qty 2, 28d supply, fill #1
  Filled 2023-07-30 – 2023-11-14 (×2): qty 2, 28d supply, fill #2
  Filled 2023-12-17: qty 2, 28d supply, fill #3
  Filled 2024-01-26: qty 2, 28d supply, fill #4
  Filled 2024-05-19: qty 2, 28d supply, fill #5

## 2023-06-11 NOTE — Telephone Encounter (Signed)
Attempted to contact the patient and left a message to call the office back for lab results.

## 2023-06-11 NOTE — Telephone Encounter (Signed)
Now addressed in associated result note. I think we need to wait until after she can see the specialist at [facility] 07/02/23 before starting any long term medications though.

## 2023-06-11 NOTE — Progress Notes (Signed)
Lab results show persistent elevated sedimentation rate of 46 and CRP of 12.7 these are about the same as her labs from 4 months ago.  This does appear to indicate some continued active inflammation. The CK and aldolase send no evidence of muscle inflammation.  The other antibody test for rheumatoid arthritis is negative. To avoid any more long-term steroid use we would have to consider more specific medications and these are ones usually need to be taken for several months at least.  I recommend waiting until she is able to see the neuro-ophthalmologist in a few weeks to get their additional input.

## 2023-06-11 NOTE — Telephone Encounter (Signed)
Patient contacted the office to inquire about her lab results from 05/20/2023. Patient call back number is [phone]. Please advise.

## 2023-06-12 NOTE — Telephone Encounter (Signed)
Patient advised in lab results note.

## 2023-06-16 ENCOUNTER — Other Ambulatory Visit (HOSPITAL_COMMUNITY): Payer: Self-pay

## 2023-06-22 ENCOUNTER — Other Ambulatory Visit (HOSPITAL_COMMUNITY): Payer: Self-pay

## 2023-06-23 ENCOUNTER — Other Ambulatory Visit: Payer: Self-pay

## 2023-06-24 ENCOUNTER — Other Ambulatory Visit (HOSPITAL_COMMUNITY): Payer: Self-pay

## 2023-06-24 MED ORDER — METOPROLOL SUCCINATE ER 25 MG PO TB24
25.0000 mg | ORAL_TABLET | Freq: Every day | ORAL | 1 refills | Status: DC
  Filled 2023-06-24: qty 90, 90d supply, fill #0
  Filled 2023-09-28: qty 90, 90d supply, fill #1

## 2023-06-30 ENCOUNTER — Other Ambulatory Visit (HOSPITAL_COMMUNITY): Payer: Self-pay

## 2023-06-30 DIAGNOSIS — E039 Hypothyroidism, unspecified: Secondary | ICD-10-CM | POA: Diagnosis not present

## 2023-06-30 DIAGNOSIS — E1165 Type 2 diabetes mellitus with hyperglycemia: Secondary | ICD-10-CM | POA: Diagnosis not present

## 2023-07-01 ENCOUNTER — Other Ambulatory Visit (HOSPITAL_COMMUNITY): Payer: Self-pay

## 2023-07-01 DIAGNOSIS — Z01419 Encounter for gynecological examination (general) (routine) without abnormal findings: Secondary | ICD-10-CM | POA: Diagnosis not present

## 2023-07-01 DIAGNOSIS — Z1151 Encounter for screening for human papillomavirus (HPV): Secondary | ICD-10-CM | POA: Diagnosis not present

## 2023-07-01 DIAGNOSIS — G4733 Obstructive sleep apnea (adult) (pediatric): Secondary | ICD-10-CM | POA: Diagnosis not present

## 2023-07-01 DIAGNOSIS — Z124 Encounter for screening for malignant neoplasm of cervix: Secondary | ICD-10-CM | POA: Diagnosis not present

## 2023-07-01 DIAGNOSIS — Z1389 Encounter for screening for other disorder: Secondary | ICD-10-CM | POA: Diagnosis not present

## 2023-07-01 DIAGNOSIS — Z1231 Encounter for screening mammogram for malignant neoplasm of breast: Secondary | ICD-10-CM | POA: Diagnosis not present

## 2023-07-01 DIAGNOSIS — Z130 Encounter for screening for diseases of the blood and blood-forming organs and certain disorders involving the immune mechanism: Secondary | ICD-10-CM | POA: Diagnosis not present

## 2023-07-01 DIAGNOSIS — N9089 Other specified noninflammatory disorders of vulva and perineum: Secondary | ICD-10-CM | POA: Diagnosis not present

## 2023-07-02 DIAGNOSIS — H47293 Other optic atrophy, bilateral: Secondary | ICD-10-CM | POA: Diagnosis not present

## 2023-07-02 DIAGNOSIS — E119 Type 2 diabetes mellitus without complications: Secondary | ICD-10-CM | POA: Diagnosis not present

## 2023-07-07 ENCOUNTER — Other Ambulatory Visit (HOSPITAL_COMMUNITY): Payer: Self-pay

## 2023-07-07 DIAGNOSIS — E1165 Type 2 diabetes mellitus with hyperglycemia: Secondary | ICD-10-CM | POA: Diagnosis not present

## 2023-07-07 DIAGNOSIS — E039 Hypothyroidism, unspecified: Secondary | ICD-10-CM | POA: Diagnosis not present

## 2023-07-07 DIAGNOSIS — E785 Hyperlipidemia, unspecified: Secondary | ICD-10-CM | POA: Diagnosis not present

## 2023-07-07 DIAGNOSIS — I10 Essential (primary) hypertension: Secondary | ICD-10-CM | POA: Diagnosis not present

## 2023-07-07 MED ORDER — JARDIANCE 10 MG PO TABS
10.0000 mg | ORAL_TABLET | Freq: Every day | ORAL | 5 refills | Status: DC
  Filled 2023-07-07 – 2023-08-04 (×3): qty 30, 30d supply, fill #0
  Filled 2023-09-02: qty 30, 30d supply, fill #1
  Filled 2023-10-06: qty 30, 30d supply, fill #2
  Filled 2023-11-09 (×2): qty 30, 30d supply, fill #3
  Filled 2024-01-14: qty 30, 30d supply, fill #4
  Filled 2024-02-10: qty 30, 30d supply, fill #5

## 2023-07-07 MED ORDER — LEVOTHYROXINE SODIUM 175 MCG PO TABS
ORAL_TABLET | ORAL | 1 refills | Status: DC
  Filled 2023-07-07: qty 30, 30d supply, fill #0
  Filled 2023-07-29 – 2023-08-04 (×2): qty 30, 30d supply, fill #1

## 2023-07-08 ENCOUNTER — Other Ambulatory Visit (HOSPITAL_COMMUNITY): Payer: Self-pay

## 2023-07-09 DIAGNOSIS — G4733 Obstructive sleep apnea (adult) (pediatric): Secondary | ICD-10-CM | POA: Diagnosis not present

## 2023-07-11 ENCOUNTER — Other Ambulatory Visit (HOSPITAL_COMMUNITY): Payer: Self-pay

## 2023-07-25 ENCOUNTER — Other Ambulatory Visit (HOSPITAL_COMMUNITY): Payer: Self-pay

## 2023-07-29 ENCOUNTER — Other Ambulatory Visit (HOSPITAL_COMMUNITY): Payer: Self-pay

## 2023-07-30 ENCOUNTER — Other Ambulatory Visit (HOSPITAL_COMMUNITY): Payer: Self-pay

## 2023-07-31 ENCOUNTER — Other Ambulatory Visit (HOSPITAL_COMMUNITY): Payer: Self-pay

## 2023-07-31 MED ORDER — BRIMONIDINE TARTRATE 0.2 % OP SOLN
1.0000 [drp] | Freq: Two times a day (BID) | OPHTHALMIC | 2 refills | Status: AC
  Filled 2023-07-31: qty 15, 75d supply, fill #0

## 2023-08-01 ENCOUNTER — Other Ambulatory Visit (HOSPITAL_COMMUNITY): Payer: Self-pay

## 2023-08-01 ENCOUNTER — Other Ambulatory Visit: Payer: Self-pay

## 2023-08-04 ENCOUNTER — Other Ambulatory Visit (HOSPITAL_COMMUNITY): Payer: Self-pay

## 2023-08-04 ENCOUNTER — Other Ambulatory Visit: Payer: Self-pay

## 2023-08-04 MED ORDER — FREESTYLE LIBRE 3 PLUS SENSOR MISC
2 refills | Status: DC
  Filled 2023-08-04: qty 2, 30d supply, fill #0
  Filled 2023-09-06: qty 2, 30d supply, fill #1
  Filled 2023-10-06: qty 2, 30d supply, fill #2

## 2023-08-06 ENCOUNTER — Other Ambulatory Visit (HOSPITAL_COMMUNITY): Payer: Self-pay

## 2023-08-08 DIAGNOSIS — G4733 Obstructive sleep apnea (adult) (pediatric): Secondary | ICD-10-CM | POA: Diagnosis not present

## 2023-08-22 ENCOUNTER — Other Ambulatory Visit (HOSPITAL_COMMUNITY)
Admission: RE | Admit: 2023-08-22 | Discharge: 2023-08-22 | Disposition: A | Discharge status: Home / Self Care | Payer: Commercial Managed Care - PPO | Attending: Nurse Practitioner | Admitting: Nurse Practitioner

## 2023-08-22 DIAGNOSIS — E039 Hypothyroidism, unspecified: Secondary | ICD-10-CM | POA: Insufficient documentation

## 2023-08-22 LAB — TSH: TSH: 0.04 u[IU]/mL — ABNORMAL LOW (ref 0.350–4.500)

## 2023-09-02 ENCOUNTER — Other Ambulatory Visit (HOSPITAL_COMMUNITY): Payer: Self-pay

## 2023-09-02 ENCOUNTER — Other Ambulatory Visit: Payer: Self-pay

## 2023-09-03 ENCOUNTER — Other Ambulatory Visit (HOSPITAL_COMMUNITY): Payer: Self-pay

## 2023-09-03 MED ORDER — LEVOTHYROXINE SODIUM 175 MCG PO TABS
ORAL_TABLET | ORAL | 5 refills | Status: DC
  Filled 2023-09-03: qty 30, 30d supply, fill #0
  Filled 2023-10-06: qty 30, 30d supply, fill #1
  Filled 2023-11-09: qty 30, 30d supply, fill #2
  Filled 2023-12-15: qty 30, 30d supply, fill #3
  Filled 2024-01-15: qty 30, 30d supply, fill #4
  Filled 2024-02-25: qty 30, 30d supply, fill #5

## 2023-09-08 ENCOUNTER — Other Ambulatory Visit (HOSPITAL_COMMUNITY): Payer: Self-pay

## 2023-09-08 DIAGNOSIS — G4733 Obstructive sleep apnea (adult) (pediatric): Secondary | ICD-10-CM | POA: Diagnosis not present

## 2023-09-10 DIAGNOSIS — F902 Attention-deficit hyperactivity disorder, combined type: Secondary | ICD-10-CM | POA: Diagnosis not present

## 2023-09-10 DIAGNOSIS — Z79899 Other long term (current) drug therapy: Secondary | ICD-10-CM | POA: Diagnosis not present

## 2023-10-01 ENCOUNTER — Other Ambulatory Visit (HOSPITAL_COMMUNITY): Payer: Self-pay

## 2023-10-18 ENCOUNTER — Other Ambulatory Visit (HOSPITAL_COMMUNITY): Payer: Self-pay

## 2023-10-20 ENCOUNTER — Other Ambulatory Visit (HOSPITAL_COMMUNITY): Payer: Self-pay

## 2023-10-20 ENCOUNTER — Other Ambulatory Visit (HOSPITAL_BASED_OUTPATIENT_CLINIC_OR_DEPARTMENT_OTHER): Payer: Self-pay

## 2023-10-20 MED ORDER — AMLODIPINE BESYLATE 5 MG PO TABS
5.0000 mg | ORAL_TABLET | Freq: Every day | ORAL | 1 refills | Status: AC
  Filled 2023-10-20: qty 90, 90d supply, fill #0
  Filled 2024-01-13: qty 90, 90d supply, fill #1

## 2023-10-20 MED ORDER — SERTRALINE HCL 100 MG PO TABS
150.0000 mg | ORAL_TABLET | Freq: Every day | ORAL | 0 refills | Status: DC
  Filled 2023-10-20: qty 135, 90d supply, fill #0

## 2023-10-21 ENCOUNTER — Other Ambulatory Visit (HOSPITAL_COMMUNITY): Payer: Self-pay

## 2023-11-09 ENCOUNTER — Other Ambulatory Visit: Payer: Self-pay

## 2023-11-09 ENCOUNTER — Other Ambulatory Visit (HOSPITAL_COMMUNITY): Payer: Self-pay

## 2023-11-12 ENCOUNTER — Other Ambulatory Visit (HOSPITAL_COMMUNITY): Payer: Self-pay

## 2023-11-12 MED ORDER — DEXTROAMPHETAMINE SULFATE ER 15 MG PO CP24
15.0000 mg | ORAL_CAPSULE | Freq: Two times a day (BID) | ORAL | 0 refills | Status: DC
  Filled 2024-05-06: qty 60, 30d supply, fill #0

## 2023-11-12 MED ORDER — DEXTROAMPHETAMINE SULFATE ER 15 MG PO CP24
15.0000 mg | ORAL_CAPSULE | Freq: Two times a day (BID) | ORAL | 0 refills | Status: AC
  Filled 2023-11-12: qty 60, 30d supply, fill #0

## 2023-11-12 MED ORDER — DEXTROAMPHETAMINE SULFATE ER 15 MG PO CP24
15.0000 mg | ORAL_CAPSULE | Freq: Two times a day (BID) | ORAL | 0 refills | Status: AC
  Filled 2024-01-13: qty 60, 30d supply, fill #0

## 2023-11-13 ENCOUNTER — Other Ambulatory Visit: Payer: Self-pay

## 2023-11-13 ENCOUNTER — Other Ambulatory Visit (HOSPITAL_COMMUNITY): Payer: Self-pay

## 2023-11-26 ENCOUNTER — Other Ambulatory Visit (HOSPITAL_COMMUNITY): Payer: Self-pay

## 2023-12-17 ENCOUNTER — Other Ambulatory Visit (HOSPITAL_COMMUNITY): Payer: Self-pay

## 2023-12-17 ENCOUNTER — Other Ambulatory Visit: Payer: Self-pay

## 2023-12-17 MED ORDER — METFORMIN HCL ER 500 MG PO TB24
1000.0000 mg | ORAL_TABLET | Freq: Every day | ORAL | 5 refills | Status: AC
  Filled 2023-12-17: qty 60, 30d supply, fill #0
  Filled 2024-03-15: qty 60, 30d supply, fill #1
  Filled 2024-07-07: qty 60, 30d supply, fill #2

## 2023-12-17 MED ORDER — TRIAZOLAM 0.25 MG PO TABS
0.5000 mg | ORAL_TABLET | ORAL | 0 refills | Status: AC
  Filled 2023-12-17: qty 2, 1d supply, fill #0

## 2023-12-26 ENCOUNTER — Other Ambulatory Visit (HOSPITAL_COMMUNITY): Payer: Self-pay

## 2023-12-26 MED ORDER — METOPROLOL SUCCINATE ER 25 MG PO TB24
25.0000 mg | ORAL_TABLET | Freq: Every day | ORAL | 0 refills | Status: DC
Start: 2023-12-26 — End: 2024-01-14
  Filled 2023-12-26: qty 30, 30d supply, fill #0

## 2023-12-30 ENCOUNTER — Other Ambulatory Visit (HOSPITAL_COMMUNITY)
Admission: RE | Admit: 2023-12-30 | Discharge: 2023-12-30 | Disposition: A | Discharge status: Home / Self Care | Source: Ambulatory Visit | Attending: Nurse Practitioner | Admitting: Nurse Practitioner

## 2023-12-30 ENCOUNTER — Other Ambulatory Visit (HOSPITAL_COMMUNITY): Payer: Self-pay

## 2023-12-30 DIAGNOSIS — E1165 Type 2 diabetes mellitus with hyperglycemia: Secondary | ICD-10-CM | POA: Insufficient documentation

## 2024-01-01 DIAGNOSIS — E1165 Type 2 diabetes mellitus with hyperglycemia: Secondary | ICD-10-CM | POA: Diagnosis not present

## 2024-01-04 ENCOUNTER — Other Ambulatory Visit (HOSPITAL_COMMUNITY): Payer: Self-pay

## 2024-01-05 ENCOUNTER — Other Ambulatory Visit (HOSPITAL_COMMUNITY): Payer: Self-pay

## 2024-01-05 MED ORDER — AMPHETAMINE-DEXTROAMPHET ER 15 MG PO CP24
15.0000 mg | ORAL_CAPSULE | Freq: Two times a day (BID) | ORAL | 0 refills | Status: DC
Start: 2024-01-05 — End: 2024-03-01
  Filled 2024-01-05 – 2024-02-25 (×2): qty 60, 30d supply, fill #0

## 2024-01-12 DIAGNOSIS — E785 Hyperlipidemia, unspecified: Secondary | ICD-10-CM | POA: Diagnosis not present

## 2024-01-12 DIAGNOSIS — E039 Hypothyroidism, unspecified: Secondary | ICD-10-CM | POA: Diagnosis not present

## 2024-01-12 DIAGNOSIS — E1165 Type 2 diabetes mellitus with hyperglycemia: Secondary | ICD-10-CM | POA: Diagnosis not present

## 2024-01-12 DIAGNOSIS — I10 Essential (primary) hypertension: Secondary | ICD-10-CM | POA: Diagnosis not present

## 2024-01-13 ENCOUNTER — Other Ambulatory Visit: Payer: Self-pay

## 2024-01-13 ENCOUNTER — Other Ambulatory Visit (HOSPITAL_COMMUNITY): Payer: Self-pay

## 2024-01-14 ENCOUNTER — Other Ambulatory Visit (HOSPITAL_COMMUNITY): Payer: Self-pay

## 2024-01-14 DIAGNOSIS — E1169 Type 2 diabetes mellitus with other specified complication: Secondary | ICD-10-CM | POA: Diagnosis not present

## 2024-01-14 DIAGNOSIS — E669 Obesity, unspecified: Secondary | ICD-10-CM | POA: Diagnosis not present

## 2024-01-14 DIAGNOSIS — F909 Attention-deficit hyperactivity disorder, unspecified type: Secondary | ICD-10-CM | POA: Diagnosis not present

## 2024-01-14 DIAGNOSIS — I10 Essential (primary) hypertension: Secondary | ICD-10-CM | POA: Diagnosis not present

## 2024-01-14 DIAGNOSIS — G4733 Obstructive sleep apnea (adult) (pediatric): Secondary | ICD-10-CM | POA: Diagnosis not present

## 2024-01-14 DIAGNOSIS — F419 Anxiety disorder, unspecified: Secondary | ICD-10-CM | POA: Diagnosis not present

## 2024-01-14 DIAGNOSIS — E782 Mixed hyperlipidemia: Secondary | ICD-10-CM | POA: Diagnosis not present

## 2024-01-14 MED ORDER — AMLODIPINE BESYLATE 5 MG PO TABS
5.0000 mg | ORAL_TABLET | Freq: Every day | ORAL | 1 refills | Status: DC
  Filled 2024-01-14 – 2024-04-21 (×2): qty 90, 90d supply, fill #0
  Filled 2024-07-20: qty 90, 90d supply, fill #1

## 2024-01-14 MED ORDER — METOPROLOL SUCCINATE ER 25 MG PO TB24
25.0000 mg | ORAL_TABLET | Freq: Every day | ORAL | 1 refills | Status: DC
  Filled 2024-01-14 – 2024-01-26 (×2): qty 90, 90d supply, fill #0
  Filled 2024-05-06: qty 90, 90d supply, fill #1

## 2024-01-14 MED ORDER — SERTRALINE HCL 100 MG PO TABS
150.0000 mg | ORAL_TABLET | Freq: Every day | ORAL | 1 refills | Status: DC
Start: 2024-01-14 — End: 2024-07-16
  Filled 2024-01-14: qty 135, 90d supply, fill #0
  Filled 2024-04-21: qty 135, 90d supply, fill #1

## 2024-01-15 ENCOUNTER — Other Ambulatory Visit (HOSPITAL_COMMUNITY): Payer: Self-pay

## 2024-01-22 ENCOUNTER — Other Ambulatory Visit (HOSPITAL_COMMUNITY): Payer: Self-pay

## 2024-01-24 ENCOUNTER — Other Ambulatory Visit (HOSPITAL_COMMUNITY): Payer: Self-pay

## 2024-01-26 ENCOUNTER — Other Ambulatory Visit (HOSPITAL_COMMUNITY): Payer: Self-pay

## 2024-01-27 ENCOUNTER — Other Ambulatory Visit (HOSPITAL_COMMUNITY): Payer: Self-pay

## 2024-01-27 DIAGNOSIS — H47293 Other optic atrophy, bilateral: Secondary | ICD-10-CM | POA: Diagnosis not present

## 2024-01-27 DIAGNOSIS — R519 Headache, unspecified: Secondary | ICD-10-CM | POA: Diagnosis not present

## 2024-01-27 MED ORDER — AMOXICILLIN 500 MG PO CAPS
500.0000 mg | ORAL_CAPSULE | Freq: Three times a day (TID) | ORAL | 0 refills | Status: AC
  Filled 2024-01-27: qty 21, 7d supply, fill #0

## 2024-01-28 ENCOUNTER — Encounter: Payer: Self-pay | Admitting: Ophthalmology

## 2024-01-29 ENCOUNTER — Encounter: Payer: Self-pay | Admitting: Ophthalmology

## 2024-01-29 ENCOUNTER — Other Ambulatory Visit: Payer: Self-pay | Admitting: Ophthalmology

## 2024-01-29 DIAGNOSIS — R519 Headache, unspecified: Secondary | ICD-10-CM

## 2024-02-02 DIAGNOSIS — R519 Headache, unspecified: Secondary | ICD-10-CM | POA: Diagnosis not present

## 2024-02-06 ENCOUNTER — Ambulatory Visit
Admission: RE | Admit: 2024-02-06 | Discharge: 2024-02-06 | Disposition: A | Discharge status: Home / Self Care | Source: Ambulatory Visit | Attending: Ophthalmology | Admitting: Ophthalmology

## 2024-02-06 DIAGNOSIS — R519 Headache, unspecified: Secondary | ICD-10-CM

## 2024-02-06 DIAGNOSIS — H5713 Ocular pain, bilateral: Secondary | ICD-10-CM | POA: Diagnosis not present

## 2024-02-06 MED ORDER — IOPAMIDOL (ISOVUE-370) INJECTION 76%
75.0000 mL | Freq: Once | INTRAVENOUS | Status: AC | PRN
  Administered 2024-02-06: 75 mL via INTRAVENOUS

## 2024-02-25 ENCOUNTER — Other Ambulatory Visit (HOSPITAL_COMMUNITY): Payer: Self-pay

## 2024-02-25 ENCOUNTER — Other Ambulatory Visit: Payer: Self-pay

## 2024-02-26 ENCOUNTER — Other Ambulatory Visit (HOSPITAL_COMMUNITY): Payer: Self-pay

## 2024-02-27 ENCOUNTER — Other Ambulatory Visit (HOSPITAL_COMMUNITY): Payer: Self-pay

## 2024-02-27 MED ORDER — DEXTROAMPHETAMINE SULFATE ER 15 MG PO CP24
15.0000 mg | ORAL_CAPSULE | Freq: Two times a day (BID) | ORAL | 0 refills | Status: AC
  Filled 2024-02-27: qty 60, 30d supply, fill #0

## 2024-03-01 ENCOUNTER — Other Ambulatory Visit (HOSPITAL_COMMUNITY): Payer: Self-pay

## 2024-03-01 DIAGNOSIS — G4733 Obstructive sleep apnea (adult) (pediatric): Secondary | ICD-10-CM | POA: Diagnosis not present

## 2024-03-02 ENCOUNTER — Other Ambulatory Visit (HOSPITAL_COMMUNITY): Payer: Self-pay

## 2024-03-03 ENCOUNTER — Other Ambulatory Visit (HOSPITAL_COMMUNITY): Payer: Self-pay

## 2024-03-03 DIAGNOSIS — F902 Attention-deficit hyperactivity disorder, combined type: Secondary | ICD-10-CM | POA: Diagnosis not present

## 2024-03-03 DIAGNOSIS — Z79899 Other long term (current) drug therapy: Secondary | ICD-10-CM | POA: Diagnosis not present

## 2024-03-03 MED ORDER — FREESTYLE LIBRE 3 SENSOR MISC
11 refills | Status: AC
  Filled 2024-03-03: qty 2, 28d supply, fill #0
  Filled 2024-04-06: qty 2, 28d supply, fill #1

## 2024-03-15 ENCOUNTER — Other Ambulatory Visit (HOSPITAL_COMMUNITY): Payer: Self-pay

## 2024-03-16 ENCOUNTER — Other Ambulatory Visit: Payer: Self-pay

## 2024-03-17 ENCOUNTER — Other Ambulatory Visit (HOSPITAL_COMMUNITY): Payer: Self-pay

## 2024-03-17 MED ORDER — JARDIANCE 10 MG PO TABS
10.0000 mg | ORAL_TABLET | Freq: Every day | ORAL | 5 refills | Status: DC
  Filled 2024-03-17: qty 30, 30d supply, fill #0
  Filled 2024-04-13: qty 30, 30d supply, fill #1
  Filled 2024-05-19: qty 30, 30d supply, fill #2
  Filled 2024-06-18: qty 30, 30d supply, fill #3
  Filled 2024-07-23: qty 30, 30d supply, fill #4
  Filled 2024-08-22: qty 30, 30d supply, fill #5
  Filled 2024-08-23: qty 30, 30d supply, fill #0

## 2024-03-30 ENCOUNTER — Other Ambulatory Visit (HOSPITAL_COMMUNITY): Payer: Self-pay

## 2024-03-30 MED ORDER — LEVOTHYROXINE SODIUM 175 MCG PO TABS
175.0000 ug | ORAL_TABLET | Freq: Every morning | ORAL | 5 refills | Status: DC
  Filled 2024-03-30: qty 30, 30d supply, fill #0
  Filled 2024-05-12 (×2): qty 30, 30d supply, fill #1
  Filled 2024-06-18: qty 30, 30d supply, fill #2
  Filled 2024-07-22: qty 30, 30d supply, fill #3
  Filled 2024-08-29: qty 30, 30d supply, fill #4
  Filled 2024-09-28: qty 30, 30d supply, fill #5

## 2024-04-05 ENCOUNTER — Other Ambulatory Visit (HOSPITAL_COMMUNITY): Payer: Self-pay

## 2024-04-05 MED ORDER — TRIAZOLAM 0.25 MG PO TABS
0.5000 mg | ORAL_TABLET | ORAL | 0 refills | Status: DC
  Filled 2024-04-05: qty 2, 1d supply, fill #0

## 2024-04-06 ENCOUNTER — Other Ambulatory Visit (HOSPITAL_COMMUNITY): Payer: Self-pay

## 2024-04-06 ENCOUNTER — Other Ambulatory Visit: Payer: Self-pay

## 2024-04-13 ENCOUNTER — Other Ambulatory Visit (HOSPITAL_COMMUNITY): Payer: Self-pay

## 2024-04-15 ENCOUNTER — Other Ambulatory Visit (HOSPITAL_COMMUNITY): Payer: Self-pay

## 2024-04-15 MED ORDER — TRIAZOLAM 0.25 MG PO TABS
0.5000 mg | ORAL_TABLET | Freq: Once | ORAL | 0 refills | Status: AC
  Filled 2024-04-15: qty 2, 1d supply, fill #0

## 2024-04-21 ENCOUNTER — Other Ambulatory Visit (HOSPITAL_COMMUNITY): Payer: Self-pay

## 2024-05-06 ENCOUNTER — Other Ambulatory Visit: Payer: Self-pay

## 2024-05-06 ENCOUNTER — Other Ambulatory Visit (HOSPITAL_COMMUNITY): Payer: Self-pay

## 2024-05-12 ENCOUNTER — Other Ambulatory Visit (HOSPITAL_COMMUNITY): Payer: Self-pay

## 2024-05-12 ENCOUNTER — Other Ambulatory Visit (HOSPITAL_BASED_OUTPATIENT_CLINIC_OR_DEPARTMENT_OTHER): Payer: Self-pay

## 2024-05-12 ENCOUNTER — Other Ambulatory Visit: Payer: Self-pay

## 2024-05-12 DIAGNOSIS — E1165 Type 2 diabetes mellitus with hyperglycemia: Secondary | ICD-10-CM | POA: Diagnosis not present

## 2024-05-12 DIAGNOSIS — E039 Hypothyroidism, unspecified: Secondary | ICD-10-CM | POA: Diagnosis not present

## 2024-05-19 ENCOUNTER — Other Ambulatory Visit (HOSPITAL_COMMUNITY): Payer: Self-pay

## 2024-05-19 DIAGNOSIS — E039 Hypothyroidism, unspecified: Secondary | ICD-10-CM | POA: Diagnosis not present

## 2024-05-19 DIAGNOSIS — E785 Hyperlipidemia, unspecified: Secondary | ICD-10-CM | POA: Diagnosis not present

## 2024-05-19 DIAGNOSIS — I10 Essential (primary) hypertension: Secondary | ICD-10-CM | POA: Diagnosis not present

## 2024-05-19 DIAGNOSIS — E1165 Type 2 diabetes mellitus with hyperglycemia: Secondary | ICD-10-CM | POA: Diagnosis not present

## 2024-05-19 MED ORDER — OZEMPIC (0.25 OR 0.5 MG/DOSE) 2 MG/3ML ~~LOC~~ SOPN
PEN_INJECTOR | SUBCUTANEOUS | 3 refills | Status: AC
  Filled 2024-05-19: qty 3, 28d supply, fill #0

## 2024-05-20 ENCOUNTER — Other Ambulatory Visit (HOSPITAL_COMMUNITY): Payer: Self-pay

## 2024-05-21 ENCOUNTER — Other Ambulatory Visit (HOSPITAL_COMMUNITY): Payer: Self-pay

## 2024-06-18 ENCOUNTER — Other Ambulatory Visit (HOSPITAL_COMMUNITY): Payer: Self-pay

## 2024-06-18 MED ORDER — FREESTYLE LIBRE 3 SENSOR MISC
1.0000 | 11 refills | Status: AC
Start: 2024-06-18 — End: ?
  Filled 2024-06-18: qty 2, 28d supply, fill #0
  Filled 2024-07-25: qty 2, 28d supply, fill #1
  Filled 2024-08-22: qty 2, 28d supply, fill #2
  Filled 2024-09-12 – 2024-09-14 (×2): qty 2, 28d supply, fill #3

## 2024-07-07 ENCOUNTER — Other Ambulatory Visit (HOSPITAL_COMMUNITY): Payer: Self-pay

## 2024-07-07 ENCOUNTER — Encounter (HOSPITAL_COMMUNITY): Payer: Self-pay

## 2024-07-07 MED ORDER — MOUNJARO 2.5 MG/0.5ML ~~LOC~~ SOAJ
2.5000 mg | SUBCUTANEOUS | 1 refills | Status: AC
  Filled 2024-07-07 – 2024-07-15 (×2): qty 2, 28d supply, fill #0
  Filled 2024-08-06 – 2024-08-10 (×2): qty 2, 28d supply, fill #1

## 2024-07-08 ENCOUNTER — Other Ambulatory Visit: Payer: Self-pay

## 2024-07-08 ENCOUNTER — Other Ambulatory Visit (HOSPITAL_COMMUNITY): Payer: Self-pay

## 2024-07-08 MED ORDER — DEXTROAMPHETAMINE SULFATE ER 15 MG PO CP24
15.0000 mg | ORAL_CAPSULE | Freq: Two times a day (BID) | ORAL | 0 refills | Status: DC
  Filled 2024-07-08: qty 60, 30d supply, fill #0

## 2024-07-09 ENCOUNTER — Other Ambulatory Visit (HOSPITAL_COMMUNITY): Payer: Self-pay

## 2024-07-15 ENCOUNTER — Other Ambulatory Visit (HOSPITAL_COMMUNITY): Payer: Self-pay

## 2024-07-16 ENCOUNTER — Other Ambulatory Visit (HOSPITAL_COMMUNITY): Payer: Self-pay

## 2024-07-16 MED ORDER — SERTRALINE HCL 100 MG PO TABS
150.0000 mg | ORAL_TABLET | Freq: Every day | ORAL | 0 refills | Status: DC
  Filled 2024-07-16: qty 45, 30d supply, fill #0
  Filled 2024-08-22: qty 45, 30d supply, fill #1
  Filled 2024-09-20: qty 45, 30d supply, fill #2

## 2024-07-19 DIAGNOSIS — H25813 Combined forms of age-related cataract, bilateral: Secondary | ICD-10-CM | POA: Diagnosis not present

## 2024-07-19 DIAGNOSIS — R7309 Other abnormal glucose: Secondary | ICD-10-CM | POA: Diagnosis not present

## 2024-07-19 DIAGNOSIS — H47293 Other optic atrophy, bilateral: Secondary | ICD-10-CM | POA: Diagnosis not present

## 2024-07-19 DIAGNOSIS — H53433 Sector or arcuate defects, bilateral: Secondary | ICD-10-CM | POA: Diagnosis not present

## 2024-07-19 LAB — OPHTHALMOLOGY REPORT-SCANNED

## 2024-07-20 ENCOUNTER — Other Ambulatory Visit (HOSPITAL_COMMUNITY): Payer: Self-pay

## 2024-07-20 MED ORDER — ONDANSETRON 4 MG PO TBDP
4.0000 mg | ORAL_TABLET | Freq: Every day | ORAL | 0 refills | Status: AC
  Filled 2024-07-20: qty 10, 10d supply, fill #0

## 2024-07-23 ENCOUNTER — Other Ambulatory Visit (HOSPITAL_COMMUNITY): Payer: Self-pay

## 2024-08-04 DIAGNOSIS — E785 Hyperlipidemia, unspecified: Secondary | ICD-10-CM | POA: Diagnosis not present

## 2024-08-06 ENCOUNTER — Other Ambulatory Visit (HOSPITAL_COMMUNITY): Payer: Self-pay

## 2024-08-06 MED ORDER — METOPROLOL SUCCINATE ER 25 MG PO TB24
25.0000 mg | ORAL_TABLET | Freq: Every day | ORAL | 0 refills | Status: DC
  Filled 2024-08-06 – 2024-08-10 (×2): qty 30, 30d supply, fill #0

## 2024-08-09 ENCOUNTER — Other Ambulatory Visit (HOSPITAL_COMMUNITY): Payer: Self-pay

## 2024-08-09 ENCOUNTER — Other Ambulatory Visit (HOSPITAL_BASED_OUTPATIENT_CLINIC_OR_DEPARTMENT_OTHER): Payer: Self-pay

## 2024-08-09 DIAGNOSIS — E785 Hyperlipidemia, unspecified: Secondary | ICD-10-CM | POA: Diagnosis not present

## 2024-08-09 DIAGNOSIS — E1165 Type 2 diabetes mellitus with hyperglycemia: Secondary | ICD-10-CM | POA: Diagnosis not present

## 2024-08-09 DIAGNOSIS — E039 Hypothyroidism, unspecified: Secondary | ICD-10-CM | POA: Diagnosis not present

## 2024-08-09 DIAGNOSIS — I10 Essential (primary) hypertension: Secondary | ICD-10-CM | POA: Diagnosis not present

## 2024-08-09 MED ORDER — MOUNJARO 2.5 MG/0.5ML ~~LOC~~ SOAJ
2.5000 mg | SUBCUTANEOUS | 3 refills | Status: AC
  Filled 2024-08-09 – 2024-09-05 (×2): qty 2, 28d supply, fill #0

## 2024-08-10 ENCOUNTER — Other Ambulatory Visit (HOSPITAL_COMMUNITY): Payer: Self-pay

## 2024-08-10 ENCOUNTER — Other Ambulatory Visit (HOSPITAL_BASED_OUTPATIENT_CLINIC_OR_DEPARTMENT_OTHER): Payer: Self-pay

## 2024-08-10 ENCOUNTER — Other Ambulatory Visit: Payer: Self-pay

## 2024-08-10 MED ORDER — ATORVASTATIN CALCIUM 10 MG PO TABS
10.0000 mg | ORAL_TABLET | Freq: Every day | ORAL | 5 refills | Status: AC
  Filled 2024-08-10: qty 30, 30d supply, fill #0
  Filled 2024-11-14: qty 90, 90d supply, fill #1

## 2024-08-14 ENCOUNTER — Other Ambulatory Visit (HOSPITAL_COMMUNITY): Payer: Self-pay

## 2024-08-17 ENCOUNTER — Other Ambulatory Visit (HOSPITAL_COMMUNITY): Payer: Self-pay

## 2024-08-17 ENCOUNTER — Other Ambulatory Visit: Payer: Self-pay

## 2024-08-18 ENCOUNTER — Other Ambulatory Visit (HOSPITAL_COMMUNITY): Payer: Self-pay

## 2024-08-23 ENCOUNTER — Other Ambulatory Visit: Payer: Self-pay

## 2024-08-24 ENCOUNTER — Other Ambulatory Visit: Payer: Self-pay

## 2024-08-26 ENCOUNTER — Other Ambulatory Visit: Payer: Self-pay

## 2024-08-29 ENCOUNTER — Other Ambulatory Visit (HOSPITAL_COMMUNITY): Payer: Self-pay

## 2024-08-30 ENCOUNTER — Other Ambulatory Visit: Payer: Self-pay

## 2024-08-31 ENCOUNTER — Other Ambulatory Visit: Payer: Self-pay

## 2024-09-01 ENCOUNTER — Other Ambulatory Visit (HOSPITAL_COMMUNITY): Payer: Self-pay

## 2024-09-01 MED ORDER — DEXTROAMPHETAMINE SULFATE ER 15 MG PO CP24
15.0000 mg | ORAL_CAPSULE | Freq: Two times a day (BID) | ORAL | 0 refills | Status: DC
  Filled 2024-09-01 – 2024-09-13 (×2): qty 60, 30d supply, fill #0

## 2024-09-02 ENCOUNTER — Other Ambulatory Visit: Payer: Self-pay

## 2024-09-06 ENCOUNTER — Other Ambulatory Visit (HOSPITAL_COMMUNITY): Payer: Self-pay

## 2024-09-06 ENCOUNTER — Other Ambulatory Visit: Payer: Self-pay

## 2024-09-07 ENCOUNTER — Other Ambulatory Visit: Payer: Self-pay

## 2024-09-07 ENCOUNTER — Other Ambulatory Visit (HOSPITAL_COMMUNITY): Payer: Self-pay

## 2024-09-07 MED ORDER — METOPROLOL SUCCINATE ER 25 MG PO TB24
25.0000 mg | ORAL_TABLET | Freq: Every day | ORAL | 0 refills | Status: DC
  Filled 2024-09-07: qty 30, 30d supply, fill #0

## 2024-09-12 ENCOUNTER — Other Ambulatory Visit (HOSPITAL_COMMUNITY): Payer: Self-pay

## 2024-09-13 ENCOUNTER — Other Ambulatory Visit: Payer: Self-pay

## 2024-09-13 ENCOUNTER — Other Ambulatory Visit (HOSPITAL_COMMUNITY): Payer: Self-pay

## 2024-09-14 ENCOUNTER — Other Ambulatory Visit (HOSPITAL_COMMUNITY): Payer: Self-pay

## 2024-09-15 ENCOUNTER — Other Ambulatory Visit (HOSPITAL_COMMUNITY): Payer: Self-pay

## 2024-09-20 ENCOUNTER — Other Ambulatory Visit: Payer: Self-pay

## 2024-09-20 ENCOUNTER — Other Ambulatory Visit (HOSPITAL_COMMUNITY): Payer: Self-pay

## 2024-09-21 ENCOUNTER — Other Ambulatory Visit (HOSPITAL_COMMUNITY): Payer: Self-pay

## 2024-09-21 ENCOUNTER — Other Ambulatory Visit: Payer: Self-pay

## 2024-09-21 MED ORDER — JARDIANCE 10 MG PO TABS
10.0000 mg | ORAL_TABLET | Freq: Every day | ORAL | 5 refills | Status: AC
  Filled 2024-09-21 – 2024-09-25 (×3): qty 30, 30d supply, fill #0
  Filled 2024-10-26: qty 30, 30d supply, fill #1
  Filled 2024-10-27: qty 90, 90d supply, fill #1

## 2024-09-23 ENCOUNTER — Other Ambulatory Visit: Payer: Self-pay

## 2024-09-23 ENCOUNTER — Other Ambulatory Visit (HOSPITAL_COMMUNITY): Payer: Self-pay

## 2024-09-23 ENCOUNTER — Other Ambulatory Visit (HOSPITAL_BASED_OUTPATIENT_CLINIC_OR_DEPARTMENT_OTHER): Payer: Self-pay

## 2024-09-25 ENCOUNTER — Other Ambulatory Visit: Payer: Self-pay

## 2024-09-27 ENCOUNTER — Other Ambulatory Visit: Payer: Self-pay

## 2024-09-28 ENCOUNTER — Other Ambulatory Visit: Payer: Self-pay

## 2024-09-28 ENCOUNTER — Other Ambulatory Visit (HOSPITAL_COMMUNITY): Payer: Self-pay

## 2024-10-01 DIAGNOSIS — Z79899 Other long term (current) drug therapy: Secondary | ICD-10-CM | POA: Diagnosis not present

## 2024-10-01 DIAGNOSIS — F902 Attention-deficit hyperactivity disorder, combined type: Secondary | ICD-10-CM | POA: Diagnosis not present

## 2024-10-06 ENCOUNTER — Other Ambulatory Visit (HOSPITAL_COMMUNITY): Payer: Self-pay

## 2024-10-11 ENCOUNTER — Other Ambulatory Visit (HOSPITAL_COMMUNITY): Payer: Self-pay

## 2024-10-12 ENCOUNTER — Other Ambulatory Visit (HOSPITAL_COMMUNITY): Payer: Self-pay

## 2024-10-12 MED ORDER — METOPROLOL SUCCINATE ER 25 MG PO TB24
25.0000 mg | ORAL_TABLET | Freq: Every day | ORAL | 0 refills | Status: DC
  Filled 2024-10-12: qty 30, 30d supply, fill #0

## 2024-10-20 ENCOUNTER — Other Ambulatory Visit: Payer: Self-pay

## 2024-10-20 ENCOUNTER — Other Ambulatory Visit (HOSPITAL_COMMUNITY): Payer: Self-pay

## 2024-10-20 MED ORDER — SERTRALINE HCL 100 MG PO TABS
150.0000 mg | ORAL_TABLET | Freq: Every day | ORAL | 0 refills | Status: DC
  Filled 2024-10-20: qty 135, 90d supply, fill #0

## 2024-10-20 MED ORDER — AMLODIPINE BESYLATE 5 MG PO TABS
5.0000 mg | ORAL_TABLET | Freq: Every day | ORAL | 0 refills | Status: DC
  Filled 2024-10-20: qty 90, 90d supply, fill #0

## 2024-10-27 ENCOUNTER — Other Ambulatory Visit (HOSPITAL_COMMUNITY): Payer: Self-pay

## 2024-10-27 ENCOUNTER — Other Ambulatory Visit: Payer: Self-pay

## 2024-10-28 ENCOUNTER — Other Ambulatory Visit: Payer: Self-pay

## 2024-10-29 ENCOUNTER — Other Ambulatory Visit: Payer: Self-pay

## 2024-11-01 ENCOUNTER — Other Ambulatory Visit (HOSPITAL_COMMUNITY): Payer: Self-pay

## 2024-11-02 ENCOUNTER — Other Ambulatory Visit (HOSPITAL_COMMUNITY): Payer: Self-pay

## 2024-11-02 MED ORDER — DEXTROAMPHETAMINE SULFATE ER 15 MG PO CP24
15.0000 mg | ORAL_CAPSULE | Freq: Two times a day (BID) | ORAL | 0 refills | Status: AC
  Filled 2024-11-02: qty 40, 20d supply, fill #0
  Filled 2024-11-02: qty 20, 10d supply, fill #0

## 2024-11-03 ENCOUNTER — Other Ambulatory Visit (HOSPITAL_COMMUNITY): Payer: Self-pay

## 2024-11-03 MED ORDER — SERTRALINE HCL 100 MG PO TABS
150.0000 mg | ORAL_TABLET | Freq: Every day | ORAL | 1 refills | Status: AC
  Filled 2024-11-03: qty 135, 90d supply, fill #0

## 2024-11-03 MED ORDER — AMLODIPINE BESYLATE 5 MG PO TABS
5.0000 mg | ORAL_TABLET | Freq: Every day | ORAL | 1 refills | Status: AC
  Filled 2024-11-03: qty 90, 90d supply, fill #0

## 2024-11-03 MED ORDER — METOPROLOL SUCCINATE ER 25 MG PO TB24
25.0000 mg | ORAL_TABLET | Freq: Every day | ORAL | 1 refills | Status: AC
  Filled 2024-11-03 – 2024-11-07 (×2): qty 90, 90d supply, fill #0

## 2024-11-04 ENCOUNTER — Other Ambulatory Visit (HOSPITAL_COMMUNITY): Payer: Self-pay

## 2024-11-07 ENCOUNTER — Other Ambulatory Visit (HOSPITAL_COMMUNITY): Payer: Self-pay

## 2024-11-08 ENCOUNTER — Other Ambulatory Visit (HOSPITAL_COMMUNITY): Payer: Self-pay

## 2024-11-08 ENCOUNTER — Other Ambulatory Visit: Payer: Self-pay

## 2024-11-08 MED ORDER — LEVOTHYROXINE SODIUM 175 MCG PO TABS
175.0000 ug | ORAL_TABLET | Freq: Every morning | ORAL | 5 refills | Status: AC
  Filled 2024-11-08: qty 90, 90d supply, fill #0

## 2024-11-15 ENCOUNTER — Other Ambulatory Visit (HOSPITAL_COMMUNITY): Payer: Self-pay

## 2024-11-18 ENCOUNTER — Other Ambulatory Visit: Payer: Self-pay

## 2024-11-22 ENCOUNTER — Other Ambulatory Visit (HOSPITAL_COMMUNITY): Payer: Self-pay

## 2024-11-22 MED ORDER — NEOMYCIN-POLYMYXIN-DEXAMETH 3.5-10000-0.1 OP OINT
TOPICAL_OINTMENT | Freq: Three times a day (TID) | OPHTHALMIC | 1 refills | Status: AC
  Filled 2024-11-22 (×2): qty 3.5, 7d supply, fill #0

## 2024-11-23 ENCOUNTER — Other Ambulatory Visit: Payer: Self-pay

## 2024-11-26 ENCOUNTER — Other Ambulatory Visit (HOSPITAL_COMMUNITY): Payer: Self-pay

## 2024-11-26 ENCOUNTER — Other Ambulatory Visit: Payer: Self-pay

## 2024-11-26 MED ORDER — NYSTATIN-TRIAMCINOLONE 100000-0.1 UNIT/GM-% EX CREA
TOPICAL_CREAM | Freq: Two times a day (BID) | CUTANEOUS | 1 refills | Status: AC | PRN
  Filled 2024-11-26: qty 15, 30d supply, fill #0
